# Patient Record
Sex: Male | Born: 1970 | Race: White | Hispanic: No | Marital: Married | Smoking: Never smoker
Health system: Southern US, Community
[De-identification: ages and names within clinical notes are randomized; demographics above are authoritative.]

## PROBLEM LIST (undated history)

## (undated) DIAGNOSIS — T7840XA Allergy, unspecified, initial encounter: Secondary | ICD-10-CM

## (undated) DIAGNOSIS — M109 Gout, unspecified: Secondary | ICD-10-CM

## (undated) DIAGNOSIS — E119 Type 2 diabetes mellitus without complications: Secondary | ICD-10-CM

## (undated) DIAGNOSIS — K649 Unspecified hemorrhoids: Secondary | ICD-10-CM

## (undated) HISTORY — DX: Unspecified hemorrhoids: K64.9

## (undated) HISTORY — DX: Gout, unspecified: M10.9

## (undated) HISTORY — DX: Allergy, unspecified, initial encounter: T78.40XA

## (undated) HISTORY — DX: Type 2 diabetes mellitus without complications: E11.9

## (undated) HISTORY — PX: NO PAST SURGERIES: SHX2092

---

## 1998-08-22 ENCOUNTER — Ambulatory Visit (HOSPITAL_COMMUNITY): Admission: RE | Admit: 1998-08-22 | Discharge: 1998-08-22 | Payer: Self-pay | Admitting: Gynecology

## 2008-12-19 ENCOUNTER — Ambulatory Visit: Payer: Self-pay | Admitting: Internal Medicine

## 2008-12-19 DIAGNOSIS — M109 Gout, unspecified: Secondary | ICD-10-CM

## 2008-12-19 LAB — CONVERTED CEMR LAB
Benzodiazepines.: NEGATIVE
Bilirubin Urine: NEGATIVE
Cocaine Metabolites: NEGATIVE
Ketones, urine, test strip: NEGATIVE
Marijuana Metabolite: NEGATIVE
Nitrite: NEGATIVE
Opiate Screen, Urine: NEGATIVE
Phencyclidine (PCP): NEGATIVE
Specific Gravity, Urine: <1.005
pH: 7

## 2008-12-20 ENCOUNTER — Encounter: Payer: Self-pay | Admitting: Internal Medicine

## 2008-12-23 ENCOUNTER — Ambulatory Visit: Payer: Self-pay | Admitting: Internal Medicine

## 2008-12-26 ENCOUNTER — Telehealth (INDEPENDENT_AMBULATORY_CARE_PROVIDER_SITE_OTHER): Payer: Self-pay | Admitting: *Deleted

## 2008-12-26 LAB — CONVERTED CEMR LAB
ALT: 58 units/L — ABNORMAL HIGH (ref 0–53)
BUN: 15 mg/dL (ref 6–23)
Basophils Absolute: 0 10*3/uL (ref 0.0–0.1)
Calcium: 9.3 mg/dL (ref 8.4–10.5)
Cholesterol: 165 mg/dL (ref 0–200)
Eosinophils Relative: 1.2 % (ref 0.0–5.0)
GFR calc non Af Amer: 71.78 mL/min (ref >60)
HCT: 43.2 % (ref 39.0–52.0)
HDL: 34.9 mg/dL — ABNORMAL LOW (ref >39.00)
LDL Cholesterol: 97 mg/dL (ref 0–99)
Lymphocytes Relative: 39.3 % (ref 12.0–46.0)
Lymphs Abs: 1.5 10*3/uL (ref 0.7–4.0)
Monocytes Relative: 7.4 % (ref 3.0–12.0)
Neutrophils Relative %: 52.1 % (ref 43.0–77.0)
Platelets: 160 10*3/uL (ref 150.0–400.0)
Potassium: 4.3 meq/L (ref 3.5–5.1)
Triglycerides: 168 mg/dL — ABNORMAL HIGH (ref 0.0–149.0)
VLDL: 33.6 mg/dL (ref 0.0–40.0)
WBC: 3.7 10*3/uL — ABNORMAL LOW (ref 4.5–10.5)

## 2010-01-16 ENCOUNTER — Ambulatory Visit: Payer: Self-pay | Admitting: Internal Medicine

## 2010-05-11 ENCOUNTER — Telehealth: Payer: Self-pay | Admitting: Internal Medicine

## 2010-09-15 NOTE — Progress Notes (Signed)
Summary: INDOMETHACIN REFILL  Phone Note Refill Request Message from:  Patient on May 11, 2010 9:08 AM  Refills Requested: Medication #1:  INDOMETHACIN 25 MG CAPS one by mouth every 4  hours as needed for gout. PLEASE CALL PRESCRIPTION INTO  [facility] [facility];  PT PHONE NUMBER = [phone]  Initial call taken by: Jerolyn Shin,  May 11, 2010 9:09 AM  Follow-up for Phone Call        last ov- 01/16/10 last filled 01/16/10 #30.  Follow-up by: Army Fossa CMA,  May 11, 2010 9:14 AM  Additional Follow-up for Phone Call Additional follow up Details #1::        OK X 1 Additional Follow-up by: Marga Melnick MD,  May 11, 2010 4:52 PM    Prescriptions: INDOMETHACIN 25 MG CAPS (INDOMETHACIN) one by mouth every 4  hours as needed for gout  #30 x 0   Entered by:   Army Fossa CMA   Authorized by:   Marga Melnick MD   Signed by:   Army Fossa CMA on 05/11/2010   Method used:   Electronically to        [id number] (retail)       [address]       [place], [place]  [address]       Ph: [phone]       Fax: [phone]   RxID:   [id number]

## 2010-09-15 NOTE — Assessment & Plan Note (Signed)
Summary: gout flare-up//lch   Vital Signs:  Patient profile:   40 year old male Height:      75.25 inches Weight:      248 pounds BMI:     30.90 Temp:     98.2 degrees F oral Pulse rate:   72 / minute BP sitting:   122 / 82  (left arm)  Vitals Entered By: Jeremy Johann CMA (January 16, 2010 10:44 AM) CC: gout flare up x27month Comments --swelling --redness --pain REVIEWED MED LIST, PATIENT AGREED DOSE AND INSTRUCTION CORRECT    History of Present Illness: 5 weeks ago developed pain, swelling, redness at the R  great toe in the setting of a golf trip with his friends. he had some dietary indiscretions Overall symptoms better, the areas is still slightly sore  Allergies (verified): No Known Drug Allergies  Past History:  Past Medical History: Reviewed history from 12/19/2008 and no changes required. Gout Dx 2000s h/o hemorrhoids w/ occ bleed   Past Surgical History: Reviewed history from 12/19/2008 and no changes required. Denies surgical history  Social History: Reviewed history from 12/19/2008 and no changes required. 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   Extremities:  L foot normal R  foot normal except for slightly  puffines @ PIP R great toe w/o redness or warmnes   Impression & Recommendations:  Problem # 1:  GOUT (ICD-274.9)  nearly resolve gout  episode has  persistent puffiness at the right great toe last uric acid 8.1, he has very infrequent episodes Plan stick to his diet Prednisone for a few days Indocid  for future attacks  His updated medication list for this problem includes:    Indomethacin 25 Mg Caps (Indomethacin) ..... One by mouth every 4  hours as needed for  gout  Complete Medication List: 1)  Vitamin C  2)  Prednisone 10 Mg Tabs (Prednisone) .... 3 by mouth once daily x 2, 2x2,1x2 3)  Indomethacin 25 Mg Caps (Indomethacin) .... One by mouth every 4  hours as needed for gout  Patient Instructions: 1)  take the prednisone now 2)  Use Indocin if needed, if he has another attack Prescriptions: INDOMETHACIN 25 MG CAPS (INDOMETHACIN) one by mouth every 4  hours as needed for gout  #30 x 0   Entered and Authorized by:   Nolon Rod. Paz MD   Signed by:   Nolon Rod. Paz MD on 01/16/2010   Method used:   Print then Give to Patient   RxID:   [id number] PREDNISONE 10 MG TABS (PREDNISONE) 3 by mouth once daily x 2, 2x2,1x2  #12 x 0   Entered and Authorized by:   Nolon Rod. Paz MD   Signed by:   Nolon Rod. Paz MD on 01/16/2010   Method used:   Print then Give to Patient   RxID:   [id number]

## 2011-06-02 ENCOUNTER — Other Ambulatory Visit: Payer: Self-pay | Admitting: Internal Medicine

## 2012-10-24 ENCOUNTER — Encounter: Payer: Self-pay | Admitting: Internal Medicine

## 2012-10-24 ENCOUNTER — Ambulatory Visit (INDEPENDENT_AMBULATORY_CARE_PROVIDER_SITE_OTHER): Payer: Managed Care, Other (non HMO) | Admitting: Internal Medicine

## 2012-10-24 VITALS — BP 122/78 | HR 67 | Temp 98.0°F | Ht 75.0 in | Wt 238.0 lb

## 2012-10-24 DIAGNOSIS — K649 Unspecified hemorrhoids: Secondary | ICD-10-CM

## 2012-10-24 MED ORDER — HYDROCORTISONE ACE-PRAMOXINE 2.5-1 % RE CREA: TOPICAL_CREAM | Freq: Three times a day (TID) | RECTAL | Status: DC

## 2012-10-24 NOTE — Patient Instructions (Addendum)
Bath sitz analpram for 4-5 days Also use Nupercainal OTC ointment as needed for pain Metamucil 2 tabs a day Please call if symptoms severe or no better in few days (GI referral) --- Schedule a physical at your convenience   Hemorrhoids Hemorrhoids are enlarged (dilated) veins around the rectum. There are 2 types of hemorrhoids, and the type of hemorrhoid is determined by its location. Internal hemorrhoids occur in the veins just inside the rectum.They are usually not painful, but they may bleed.However, they may poke through to the outside and become irritated and painful. External hemorrhoids involve the veins outside the anus and can be felt as a painful swelling or hard lump near the anus.They are often itchy and may crack and bleed. Sometimes clots will form in the veins. This makes them swollen and painful. These are called thrombosed hemorrhoids. CAUSES Causes of hemorrhoids include:  Pregnancy. This increases the pressure in the hemorrhoidal veins.  Constipation.  Straining to have a bowel movement.  Obesity.  Heavy lifting or other activity that caused you to strain. TREATMENT Most of the time hemorrhoids improve in 1 to 2 weeks. However, if symptoms do not seem to be getting better or if you have a lot of rectal bleeding, your caregiver may perform a procedure to help make the hemorrhoids get smaller or remove them completely.Possible treatments include:  Rubber band ligation. A rubber band is placed at the base of the hemorrhoid to cut off the circulation.  Sclerotherapy. A chemical is injected to shrink the hemorrhoid.  Infrared light therapy. Tools are used to burn the hemorrhoid.  Hemorrhoidectomy. This is surgical removal of the hemorrhoid. HOME CARE INSTRUCTIONS   Increase fiber in your diet. Ask your caregiver about using fiber supplements.  Drink enough water and fluids to keep your urine clear or pale yellow.  Exercise regularly.  Go to the bathroom  when you have the urge to have a bowel movement. Do not wait.  Avoid straining to have bowel movements.  Keep the anal area dry and clean.  Only take over-the-counter or prescription medicines for pain, discomfort, or fever as directed by your caregiver. If your hemorrhoids are thrombosed:  Take warm sitz baths for 20 to 30 minutes, 3 to 4 times per day.  If the hemorrhoids are very tender and swollen, place ice packs on the area as tolerated. Using ice packs between sitz baths may be helpful. Fill a plastic bag with ice. Place a towel between the bag of ice and your skin.  Medicated creams and suppositories may be used or applied as directed.  Do not use a donut-shaped pillow or sit on the toilet for long periods. This increases blood pooling and pain. SEEK MEDICAL CARE IF:   You have increasing pain and swelling that is not controlled with your medicine.  You have uncontrolled bleeding.  You have difficulty or you are unable to have a bowel movement.  You have pain or inflammation outside the area of the hemorrhoids.  You have chills or an oral temperature above 102 F (38.9 C). MAKE SURE YOU:   Understand these instructions.  Will watch your condition.  Will get help right away if you are not doing well or get worse. Document Released: 07/30/2000 Document Revised: 10/25/2011 Document Reviewed: 07/13/2010 [facility] Patient Information 2013 [place], [place].

## 2012-10-24 NOTE — Progress Notes (Signed)
  Subjective:    Patient ID: Steve Allen, male    DOB: 12/28/1970, 42 y.o.   MRN: [id number]  HPI Last visit 2011, seen acutely today for hemorrhoids. Last week developed red blood per rectum as well as pain with bowel movements. He use some OTC creams ; the bleeding has to stop now  but he still has some pain particularly on the left side of the anorectal area. No actual lump, mild swelling? Has no family history of colon cancer.  Past Medical History  Diagnosis Date  . Gout   . Hemorrhoids    Past Surgical History  Procedure Laterality Date  . No past surgeries      History   Social History  . Marital Status: Married    Spouse Name: N/A    Number of Children: 1  . Years of Education: N/A   Occupational History  . 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 Smoking status: Never Smoker   . Smokeless tobacco: Never Used  . Alcohol Use: Yes     Comment: 1-2 glasses of wine or beer a week  . Drug Use: No  . Sexually Active: Yes   Other Topics Concern  . Not on file   Social History Narrative   grow up in [place] , in [place] since 1995                Review of Systems  no fever, chills, nausea, vomiting, diarrhea    Objective:   Physical Exam  General -- alert, well-developed  HEENT -- not pale or jaundice Abdomen--soft, non-tender, no distention, no masses, no HSM, no guarding, and no rigidity.   Extremities-- no pretibial edema bilaterally Rectal-- skin tag externally . Normal sphincter tone. No rectal masses; mild to moderate discomfort during examination. t Brown stool  Prostate:  Prostate gland firm and smooth, no enlargement, nodularity, tenderness Anoscopy -- several moderate size internal hemorrhoids, 2 of them with evidence of recent bleeding Neurologic-- alert & oriented X3 and strength normal in all extremities. Psych-- Cognition and judgment appear intact. Alert and cooperative with normal attention span and concentration.   not anxious appearing and not depressed appearing.      Assessment & Plan:

## 2012-10-24 NOTE — Assessment & Plan Note (Addendum)
Recent sx likely   from int hemorrhoids Plan: See instructions If sx severe or persisten will rec GI eval as pt never had a cscope ( no family history of colon cancer)

## 2013-03-27 ENCOUNTER — Telehealth: Payer: Self-pay | Admitting: Internal Medicine

## 2013-03-27 MED ORDER — INDOMETHACIN 25 MG PO CAPS: ORAL_CAPSULE | ORAL | Status: DC

## 2013-03-27 NOTE — Telephone Encounter (Signed)
Advise patient, needs an appointment for a yearly checkup for at least a routine checkup for gout. Okay #30, no refills

## 2013-03-27 NOTE — Telephone Encounter (Signed)
Refill done per orders. lmovm to return call and schedule OV. Mailed letter as well.

## 2013-03-27 NOTE — Telephone Encounter (Signed)
Noted. Mailed letter as well making aware of need for office visit.

## 2013-03-27 NOTE — Telephone Encounter (Signed)
Patient returned Stephanie's call. I advised pt he needed an appt (per Dr Drue Novel). Patient stated he would call back if he wanted to do that then hung up the phone.

## 2013-03-27 NOTE — Telephone Encounter (Signed)
Patient is calling to request a refill on his indomethacin (INDOCIN) 25 MG capsule medication. Last OV 10/24/12. Please advise.

## 2013-03-27 NOTE — Telephone Encounter (Signed)
Ok to refill Indocin? Last OV 3.11.14 Last filled 10.19.12 #30 no refills. Pt. Takes as needed for gout flare up.

## 2013-04-27 ENCOUNTER — Ambulatory Visit (INDEPENDENT_AMBULATORY_CARE_PROVIDER_SITE_OTHER): Payer: Managed Care, Other (non HMO) | Admitting: Internal Medicine

## 2013-04-27 VITALS — BP 162/85 | HR 100 | Temp 97.5°F | Wt 238.0 lb

## 2013-04-27 DIAGNOSIS — K649 Unspecified hemorrhoids: Secondary | ICD-10-CM

## 2013-04-27 MED ORDER — HYDROCORTISONE ACE-PRAMOXINE 2.5-1 % RE CREA: TOPICAL_CREAM | Freq: Three times a day (TID) | RECTAL | Status: DC

## 2013-04-27 NOTE — Assessment & Plan Note (Signed)
Ongoing sx. Recommend maximal med therapy: Metamucil  Add a stool softener Refer to GI (Also, BP slightly high, usually okay. Plan--Observation)

## 2013-04-27 NOTE — Progress Notes (Signed)
  Subjective:    Patient ID: Steve Allen, male    DOB: 05/27/1971, 42 y.o.   MRN: [id number]  HPI Here to discuss hemorrhoids. Still having symptoms every 2 or 3 weeks, symptoms are mostly discomfort when sitting, occasional bleeding, no itching. Uses analpram  and it helps temporarily.  Past Medical History  Diagnosis Date  . Gout   . Hemorrhoids    Past Surgical History  Procedure Laterality Date  . No past surgeries     History   Social History  . Marital Status: Married    Spouse Name: N/A    Number of Children: 1  . Years of Education: N/A   Occupational History  . 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 Smoking status: Never Smoker   . Smokeless tobacco: Never Used  . Alcohol Use: Yes     Comment: 1-2 glasses of wine or beer a week  . Drug Use: No  . Sexual Activity: Yes   Other Topics Concern  . Not on file   Social History Narrative   grow up in [place] , in [place] since 1995                  Review of Systems Diet has not changed Is now taking Metamucil Occasional constipation but not frequently. Subjectively, noticed  that symptoms increase after straining for instance lifting his children and playing w/ them      Objective:   Physical Exam BP 162/85  Pulse 100  Temp(Src) 97.5 F (36.4 C)  Wt 238 lb (107.956 kg)  BMI 29.75 kg/m2  SpO2 100%  General -- alert, well-developed, NAD.  Rectal--  external exam (-) except x 1 skin tag. Normal sphincter tone. No rectal masses or tenderness. Brown stool  Extremities-- no pretibial edema bilaterally  Psych-- Cognition and judgment appear intact. Alert and cooperative with normal attention span and concentration. not anxious appearing and not depressed appearing.       Assessment & Plan:

## 2013-04-27 NOTE — Patient Instructions (Signed)
Take Metamucil 2 capsules every day Colace 50 mg one tablet every day, this is a OTC  stool softener Use the ointment as needed Avoid straining We'll send you to the stomach doctor for evaluation and possibly treatment

## 2013-04-28 ENCOUNTER — Encounter: Payer: Self-pay | Admitting: Internal Medicine

## 2013-05-11 ENCOUNTER — Encounter: Payer: Self-pay | Admitting: Internal Medicine

## 2013-06-12 ENCOUNTER — Encounter: Payer: Self-pay | Admitting: Internal Medicine

## 2013-06-12 ENCOUNTER — Ambulatory Visit (INDEPENDENT_AMBULATORY_CARE_PROVIDER_SITE_OTHER): Payer: Managed Care, Other (non HMO) | Admitting: Internal Medicine

## 2013-06-12 VITALS — BP 138/72 | HR 80 | Ht 75.0 in | Wt 239.0 lb

## 2013-06-12 DIAGNOSIS — K649 Unspecified hemorrhoids: Secondary | ICD-10-CM

## 2013-06-12 NOTE — Patient Instructions (Signed)
Discontinue Analpram.  Continue taking Colace and eating fiber.  Follow up as needed                                               We are excited to introduce MyChart, a new best-in-class service that provides you online access to important information in your electronic medical record. We want to make it easier for you to view your health information - all in one secure location - when and where you need it. We expect MyChart will enhance the quality of care and service we provide.  When you register for MyChart, you can:    View your test results.    Request appointments and receive appointment reminders via email.    Request medication renewals.    View your medical history, allergies, medications and immunizations.    Communicate with your physician's office through a password-protected site.    Conveniently print information such as your medication lists.  To find out if MyChart is right for you, please talk to a member of our clinical staff today. We will gladly answer your questions about this free health and wellness tool.  If you are age 43 or older and want a member of your family to have access to your record, you must provide written consent by completing a proxy form available at our office. Please speak to our clinical staff about guidelines regarding accounts for patients younger than age 43.  As you activate your MyChart account and need any technical assistance, please call the MyChart technical support line at [phone]) [phone] [phone]) or email your question to [email or web address]. If you email your question(s), please include your name, a return phone number and the best time to reach you.  If you have non-urgent health-related questions, you can send a message to our office through MyChart at [email or web address]. If you have a medical emergency, call 911.  Thank you for using MyChart as your new health and wellness resource!   MyChart licensed from  [employer],  [id number]. Patents Pending.

## 2013-06-12 NOTE — Progress Notes (Signed)
Patient ID: Steve Allen, male   DOB: 22-Dec-1970, 42 y.o.   MRN: [id number] HPI: Steve Allen is a 42 year old male with a past medical history of gout and hemorrhoids is seen in consultation at request of Dr. Drue Novel for evaluation of hemorrhoids.  He is here alone today. He reports his issues with hemorrhoids and perianal pain and irritation started around January 2014. During the last 10 months he has had on and off flares the last of which was about 4 weeks ago. He reports his symptoms usually start with a hard stool and occasionally he does see scant red blood on the toilet tissue only. He reports that the pain is worse with passing the stool and it can be uncomfortable when he sits for prolonged periods. He reports he was started on Metamucil capsules and docusate one tablet daily. He was also given Analpram cream. With this his symptoms have improved and he is no longer having perianal pain or bleeding. He reports his stools are now no longer hard and have softened. He is having one stool a day. No melena. No abdominal pain. No weight loss. No change in bowel habits. No diarrhea or constipation. Good appetite. No fevers or chills. No nausea or vomiting.  He recalls 2 previous rectal exams both performed by primary care. He was told he had internal hemorrhoids.  Past Medical History  Diagnosis Date  . Gout   . Hemorrhoids     Past Surgical History  Procedure Laterality Date  . No past surgeries      Current Outpatient Prescriptions  Medication Sig Dispense Refill  . docusate sodium (COLACE) 50 MG capsule Take by mouth daily.      . indomethacin (INDOCIN) 25 MG capsule TAKE 1 CAPSULE BY MOUTH EVERY 4 HOURS AS NEEDED FOR GOUT  30 capsule  0  . psyllium (METAMUCIL) 0.52 G capsule Take 0.52 g by mouth 2 (two) times daily.       No current facility-administered medications for this visit.    No Known Allergies  Family History  Problem Relation Age of Onset  . Cancer - Colon Neg Hx   .  Cancer - Prostate Neg Hx     History  Substance Use Topics  . Smoking status: Never Smoker   . Smokeless tobacco: Never Used  . Alcohol Use: Yes     Comment: 1-2 glasses of wine or beer a week    ROS: As per history of present illness, otherwise negative  BP 138/72  Pulse 80  Ht 6\' 3"  (1.905 m)  Wt 239 lb (108.41 kg)  BMI 29.87 kg/m2 Constitutional: Well-developed and well-nourished. No distress. HEENT: Normocephalic and atraumatic. Oropharynx is clear and moist. No oropharyngeal exudate. Conjunctivae are normal.  No scleral icterus. Neck: Neck supple. Trachea midline. Cardiovascular: Normal rate, regular rhythm and intact distal pulses. No M/R/G Pulmonary/chest: Effort normal and breath sounds normal. No wheezing, rales or rhonchi. Abdominal: Soft, nontender, nondistended. Bowel sounds active throughout. There are no masses palpable. No hepatosplenomegaly. Extremities: no clubbing, cyanosis, or edema Lymphadenopathy: No cervical adenopathy noted. Neurological: Alert and oriented to person place and time. Skin: Skin is warm and dry. No rashes noted. Psychiatric: Normal mood and affect. Behavior is normal.  RELEVANT LABS AND IMAGING: CBC    Component Value Date/Time   WBC 3.7* 12/19/2008 1001   RBC 4.40 12/19/2008 1001   HGB 15.4 12/19/2008 1001   HCT 43.2 12/19/2008 1001   PLT 160.0 12/19/2008 1001   MCV 98.2 12/19/2008  1001   MCHC 35.5 12/19/2008 1001   RDW 11.5 12/19/2008 1001   LYMPHSABS 1.5 12/19/2008 1001   MONOABS 0.3 12/19/2008 1001   EOSABS 0.0 12/19/2008 1001   BASOSABS 0.0 12/19/2008 1001    CMP     Component Value Date/Time   NA 141 12/19/2008 1001   K 4.3 12/19/2008 1001   CL 104 12/19/2008 1001   CO2 31 12/19/2008 1001   GLUCOSE 101* 12/19/2008 1001   BUN 15 12/19/2008 1001   CREATININE 1.2 12/19/2008 1001   CALCIUM 9.3 12/19/2008 1001   AST 36 12/19/2008 1001   ALT 58* 12/19/2008 1001   GFRNONAA 71.78 12/19/2008 1001   Rectal exam March and September 2014 -- the 1st with internal  hemorrhoids at anoscopy, the second was tender and skin tag present, otherwise neg, brown stool  ASSESSMENT/PLAN:  42 year old male with a past medical history of gout and hemorrhoids is seen in consultation at request of Dr. Drue Novel for evaluation of hemorrhoids.   1.  Hemorrhoids/peri-anal pain -- he has responded completely to the addition of fiber to his diet and daily Docusate. At this point I think he can stop the Analpram altogether. We discussed the differential for his perianal pain which includes external hemorrhoids and fissure. Some of his symptoms seem fissure related, but no fissure was seen recently on rectal examination. At this point he is asymptomatic. I have recommended he continue daily fiber supplementation and Docusate 100 mg daily indefinitely. There are no other alarm symptoms to warrant colonoscopy at this time. We discussed this and he agrees with this plan. If his pain returns or further bleeding occurs, I have asked him to let me know he voices understanding.  2.  CRC screening -- average risk, colonoscopy for screening recommended age 13

## 2013-11-03 ENCOUNTER — Encounter (HOSPITAL_BASED_OUTPATIENT_CLINIC_OR_DEPARTMENT_OTHER): Payer: Self-pay | Admitting: Emergency Medicine

## 2013-11-03 ENCOUNTER — Emergency Department (HOSPITAL_BASED_OUTPATIENT_CLINIC_OR_DEPARTMENT_OTHER)
Admission: EM | Admit: 2013-11-03 | Discharge: 2013-11-03 | Disposition: A | Discharge status: Home / Self Care | Payer: Managed Care, Other (non HMO) | Attending: Emergency Medicine | Admitting: Emergency Medicine

## 2013-11-03 DIAGNOSIS — Z79899 Other long term (current) drug therapy: Secondary | ICD-10-CM | POA: Insufficient documentation

## 2013-11-03 DIAGNOSIS — M109 Gout, unspecified: Secondary | ICD-10-CM | POA: Insufficient documentation

## 2013-11-03 DIAGNOSIS — Z8679 Personal history of other diseases of the circulatory system: Secondary | ICD-10-CM | POA: Insufficient documentation

## 2013-11-03 DIAGNOSIS — Y929 Unspecified place or not applicable: Secondary | ICD-10-CM | POA: Insufficient documentation

## 2013-11-03 DIAGNOSIS — Y9389 Activity, other specified: Secondary | ICD-10-CM | POA: Insufficient documentation

## 2013-11-03 DIAGNOSIS — IMO0002 Reserved for concepts with insufficient information to code with codable children: Secondary | ICD-10-CM | POA: Insufficient documentation

## 2013-11-03 DIAGNOSIS — S71109A Unspecified open wound, unspecified thigh, initial encounter: Principal | ICD-10-CM | POA: Insufficient documentation

## 2013-11-03 DIAGNOSIS — Z23 Encounter for immunization: Secondary | ICD-10-CM | POA: Insufficient documentation

## 2013-11-03 DIAGNOSIS — S71009A Unspecified open wound, unspecified hip, initial encounter: Secondary | ICD-10-CM | POA: Insufficient documentation

## 2013-11-03 DIAGNOSIS — S71119A Laceration without foreign body, unspecified thigh, initial encounter: Secondary | ICD-10-CM

## 2013-11-03 MED ORDER — TETANUS-DIPHTH-ACELL PERTUSSIS 5-2.5-18.5 LF-MCG/0.5 IM SUSP
0.5000 mL | Freq: Once | INTRAMUSCULAR | Status: AC
  Administered 2013-11-03: 0.5 mL via INTRAMUSCULAR
  Filled 2013-11-03: qty 0.5

## 2013-11-03 NOTE — Discharge Instructions (Signed)
Staples out in 7 days.    Laceration Care, Adult A laceration is a cut that goes through all layers of the skin. The cut goes into the tissue beneath the skin. HOME CARE For stitches (sutures) or staples:  Keep the cut clean and dry.  If you have a bandage (dressing), change it at least once a day. Change the bandage if it gets wet or dirty, or as told by your doctor.  Wash the cut with soap and water 2 times a day. Rinse the cut with water. Pat it dry with a clean towel.  Put a thin layer of medicated cream on the cut as told by your doctor.  You may shower after the first 24 hours. Do not soak the cut in water until the stitches are removed.  Only take medicines as told by your doctor.  Have your stitches or staples removed as told by your doctor. For skin adhesive strips:  Keep the cut clean and dry.  Do not get the strips wet. You may take a bath, but be careful to keep the cut dry.  If the cut gets wet, pat it dry with a clean towel.  The strips will fall off on their own. Do not remove the strips that are still stuck to the cut. For wound glue:  You may shower or take baths. Do not soak or scrub the cut. Do not swim. Avoid heavy sweating until the glue falls off on its own. After a shower or bath, pat the cut dry with a clean towel.  Do not put medicine on your cut until the glue falls off.  If you have a bandage, do not put tape over the glue.  Avoid lots of sunlight or tanning lamps until the glue falls off. Put sunscreen on the cut for the first year to reduce your scar.  The glue will fall off on its own. Do not pick at the glue. You may need a tetanus shot if:  You cannot remember when you had your last tetanus shot.  You have never had a tetanus shot. If you need a tetanus shot and you choose not to have one, you may get tetanus. Sickness from tetanus can be serious. GET HELP RIGHT AWAY IF:   Your pain does not get better with medicine.  Your arm, hand,  leg, or foot loses feeling (numbness) or changes color.  Your cut is bleeding.  Your joint feels weak, or you cannot use your joint.  You have painful lumps on your body.  Your cut is red, puffy (swollen), or painful.  You have a red line on the skin near the cut.  You have yellowish-white fluid (pus) coming from the cut.  You have a fever.  You have a bad smell coming from the cut or bandage.  Your cut breaks open before or after stitches are removed.  You notice something coming out of the cut, such as wood or glass.  You cannot move a finger or toe. MAKE SURE YOU:   Understand these instructions.  Will watch your condition.  Will get help right away if you are not doing well or get worse. Document Released: 01/19/2008 Document Revised: 10/25/2011 Document Reviewed: 01/26/2011 [facility] Patient Information 2014 [place].

## 2013-11-03 NOTE — ED Notes (Signed)
Reports lacerated right anterior thigh with a power trimmer approx 1 hour.  Denies injury elsewhere.  Bleeding is controlled on arrival.  Pulses present in his feet.  Laceration is jagged, partial thickness, approx 4 cm long.

## 2013-11-03 NOTE — ED Provider Notes (Signed)
CSN: [id number]     Arrival date & time 11/03/13  1458 History  This chart was scribed for Shaune Pollack, MD by Maree Erie, ED Scribe. The patient was seen in room MH01/MH01. Patient's care was started at 4:34 PM.    Chief Complaint  Patient presents with  . Extremity Laceration     Patient is a 43 y.o. male presenting with skin laceration. The history is provided by the patient. No language interpreter was used.  Laceration Location:  Leg Leg laceration location:  R upper leg Length (cm):  7 cm Quality: jagged   Bleeding: controlled   Time since incident:  2 hours Laceration mechanism:  Metal edge Foreign body present:  No foreign bodies Ineffective treatments:  None tried Tetanus status:  Unknown   HPI Comments: Steve Allen is a 43 y.o. male who presents to the Emergency Department complaining of a jagged, approximately 7 cm right anterior mid thigh laceration that occurred two hours ago. He states he was using an edge trimmer and accidentally hit his leg with the tool. The bleeding is currently controlled. He denies that the wound has been washed since the incident occurred. He is unsure if he is up to date on his tetanus vaccination.     Past Medical History  Diagnosis Date  . Gout   . Hemorrhoids    Past Surgical History  Procedure Laterality Date  . No past surgeries     Family History  Problem Relation Age of Onset  . Cancer - Colon Neg Hx   . Cancer - Prostate Neg Hx    History  Substance Use Topics  . Smoking status: Never Smoker   . Smokeless tobacco: Never Used  . Alcohol Use: Yes     Comment: 1-2 glasses of wine or beer a week    Review of Systems  Constitutional: Negative for fever.  Skin: Positive for wound.  All other systems reviewed and are negative.      Allergies  Review of patient's allergies indicates no known allergies.  Home Medications   Current Outpatient Rx  Name  Route  Sig  Dispense  Refill  . docusate sodium  (COLACE) 50 MG capsule   Oral   Take by mouth daily.         . indomethacin (INDOCIN) 25 MG capsule      TAKE 1 CAPSULE BY MOUTH EVERY 4 HOURS AS NEEDED FOR GOUT   30 capsule   0     Due for office visit and labs for further refills   . psyllium (METAMUCIL) 0.52 G capsule   Oral   Take 0.52 g by mouth 2 (two) times daily.          Triage Vitals: BP 116/76  Pulse 96  Temp(Src) 98.8 F (37.1 C) (Oral)  Resp 16  Ht 6\' 3"  (1.905 m)  Wt 235 lb (106.595 kg)  BMI 29.37 kg/m2  SpO2 97%  Physical Exam  Nursing note and vitals reviewed. Constitutional: He is oriented to person, place, and time. He appears well-developed and well-nourished. No distress.  HENT:  Head: Normocephalic and atraumatic.  Eyes: EOM are normal.  Neck: Neck supple. No tracheal deviation present.  Cardiovascular: Normal rate and intact distal pulses.   Pulmonary/Chest: Effort normal. No respiratory distress.  Musculoskeletal: Normal range of motion.  Neurological: He is alert and oriented to person, place, and time.  Skin: Skin is warm and dry.  Right anterior thigh has a 7 cm jagged  laceration. Bleeding is currently controlled.  Psychiatric: He has a normal mood and affect. His behavior is normal.    ED Course  Procedures (including critical care time)  DIAGNOSTIC STUDIES: Oxygen Saturation is 97% on room air, adequate by my interpretation.    COORDINATION OF CARE: 4:37 PM -Will wash and repair laceration using staples. Patient verbalizes understanding and agrees with treatment plan.  LACERATION REPAIR PROCEDURE NOTE The patient's identification was confirmed and consent was obtained. This procedure was performed by Shaune Pollack, MD at 4:38 PM. Site: right anterior thigh Sterile procedures observed Anesthetic used (type and amt): 4 cc lidocaine with epinephrine Repair type used: Staples Length: 7 cm # of staples: 8 Antibx ointment applied Tetanus ordered Site anesthetized, irrigated  with NS and debrided, some tattooing of laceration, explored without evidence of foreign body, wound well approximated, site covered with dry, sterile dressing. Patient tolerated procedure well without complications. Instructions for care discussed verbally and patient provided with additional written instructions for homecare and f/u.   Labs Review Labs Reviewed - No data to display Imaging Review No results found.   EKG Interpretation None      MDM   Final diagnoses:  Laceration of thigh  I personally performed the services described in this documentation, which was scribed in my presence. The recorded information has been reviewed and considered.     Shaune Pollack, MD 11/03/13 [phone]

## 2014-10-02 ENCOUNTER — Ambulatory Visit (INDEPENDENT_AMBULATORY_CARE_PROVIDER_SITE_OTHER): Payer: Managed Care, Other (non HMO) | Admitting: Internal Medicine

## 2014-10-02 VITALS — BP 138/90 | HR 82 | Resp 16 | Ht 75.0 in | Wt 252.6 lb

## 2014-10-02 DIAGNOSIS — K6289 Other specified diseases of anus and rectum: Secondary | ICD-10-CM

## 2014-10-02 DIAGNOSIS — K602 Anal fissure, unspecified: Secondary | ICD-10-CM

## 2014-10-02 MED ORDER — AMBULATORY NON FORMULARY MEDICATION: Status: DC

## 2014-10-02 NOTE — Progress Notes (Signed)
   Subjective:    Patient ID: Steve Allen, male    DOB: [birth year], 44 y.o.   MRN: [id number]  HPI Mr. Valent is a 44 yo male with PMH of gout and internal hemorrhoids who is seen in follow-up. He was initially seen in October 2014 to evaluate hemorrhoids and perianal pain. He reports that he has been doing well though in December he developed a "flare" of what he felt to be hemorrhoids. This usually follows a large or hard stool. He describes pain with passing bowel movement and then soreness perianally for several days thereafter. Pain is worse after sitting. 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 Pain is better if he gets up walks around or lies down. He does occasionally see bright red blood on the toilet taper or stool after having painful defecation. No fecal seepage or perianal protrusions. No abdominal pain. No diarrhea. No change in bowel habit. Good appetite and stable weight. No nausea or vomiting. No upper GI hepatic biliary complaint. He is using docusate and fiber supplementation daily.  Review of Systems As per history of present illness, otherwise negative  Current Medications, Allergies, Past Medical History, Past Surgical History, Family History and Social History were reviewed in [employer] record.     Objective:   Physical Exam BP 138/90 mmHg  Pulse 82  Resp 16  Ht 6\' 3"  (1.905 m)  Wt 252 lb 9.6 oz (114.579 kg)  BMI 31.57 kg/m2 Constitutional: Well-developed and well-nourished. No distress. HEENT: Normocephalic and atraumatic.   No scleral icterus. Cardiovascular: Normal rate, regular rhythm and intact distal pulses.  Pulmonary/chest: Effort normal and breath sounds normal. No wheezing, rales or rhonchi. Abdominal: Soft, nontender, nondistended. Bowel sounds active throughout.  Rectal: Small skin tag, exquisite tenderness posteriorly with insertion of finger with no palpable mass, fissure not visualized today though endoscopy not  performed due to tenderness Extremities: no clubbing, cyanosis, or edema Neurological: Alert and oriented to person place and time. Skin: Skin is warm and dry. No rashes noted. Psychiatric: Normal mood and affect. Behavior is normal.     Assessment & Plan:  44 yo male with PMH of gout and internal hemorrhoids who is seen in follow-up.  1. Anal fissure -- symptoms are classic for anal fissure in that he is having pain with passing stool and then pain after defecation and with sitting. No evidence of external or thrombosed hemorrhoid today. Internal hemorrhoids would not be expected to cause such pain. I have recommended adjuvant glycerin ointment 0.125% 4 times daily for at least 2-3 weeks. I asked that he call me in 2-3 weeks to let me know if symptoms have resolved completely. If not we may need to increase dose of nitroglycerin or considered flexible sigmoidoscopy versus colonoscopy. Though we discussed colonoscopy, given symptoms classic for fissure, we have elected to not perform colonoscopy at this time. He is comfortable with this decision.

## 2014-10-02 NOTE — Patient Instructions (Addendum)
We have sent the following medications to your pharmacy for you to pick up at your convenience: Nitroglycerin 0.125% gel-Apply to rectum four times daily x 6 weeks  Please continue benefiber supplementation and Colace.  Please call our office in 1 month with an update on your condition. Our phone number is [phone].  CC:[facility]

## 2018-10-02 ENCOUNTER — Ambulatory Visit (INDEPENDENT_AMBULATORY_CARE_PROVIDER_SITE_OTHER): Payer: Managed Care, Other (non HMO)

## 2018-10-02 ENCOUNTER — Encounter: Payer: Self-pay | Admitting: Family Medicine

## 2018-10-02 ENCOUNTER — Ambulatory Visit (INDEPENDENT_AMBULATORY_CARE_PROVIDER_SITE_OTHER): Payer: Managed Care, Other (non HMO) | Admitting: Family Medicine

## 2018-10-02 VITALS — BP 130/80 | HR 92 | Ht 75.0 in | Wt 247.5 lb

## 2018-10-02 DIAGNOSIS — R05 Cough: Secondary | ICD-10-CM

## 2018-10-02 DIAGNOSIS — Z0000 Encounter for general adult medical examination without abnormal findings: Secondary | ICD-10-CM | POA: Diagnosis not present

## 2018-10-02 DIAGNOSIS — R059 Cough, unspecified: Secondary | ICD-10-CM | POA: Insufficient documentation

## 2018-10-02 DIAGNOSIS — M109 Gout, unspecified: Secondary | ICD-10-CM | POA: Diagnosis not present

## 2018-10-02 NOTE — Patient Instructions (Signed)
Health Maintenance, Male A healthy lifestyle and preventive care is important for your health and wellness. Ask your health care provider about what schedule of regular examinations is right for you. What should I know about weight and diet? Eat a Healthy Diet  Eat plenty of vegetables, fruits, whole grains, low-fat dairy products, and lean protein.  Do not eat a lot of foods high in solid fats, added sugars, or salt.  Maintain a Healthy Weight Regular exercise can help you achieve or maintain a healthy weight. You should:  Do at least 150 minutes of exercise each week. The exercise should increase your heart rate and make you sweat (moderate-intensity exercise).  Do strength-training exercises at least twice a week. Watch Your Levels of Cholesterol and Blood Lipids  Have your blood tested for lipids and cholesterol every 5 years starting at 49 years of age. If you are at high risk for heart disease, you should start having your blood tested when you are 49 years old. You may need to have your cholesterol levels checked more often if: ? Your lipid or cholesterol levels are high. ? You are older than 50 years of age. ? You are at high risk for heart disease. What should I know about cancer screening? Many types of cancers can be detected early and may often be prevented. Lung Cancer  You should be screened every year for lung cancer if: ? You are a current smoker who has smoked for at least 30 years. ? You are a former smoker who has quit within the past 15 years.  Talk to your health care provider about your screening options, when you should start screening, and how often you should be screened. Colorectal Cancer  Routine colorectal cancer screening usually begins at 50 years of age and should be repeated every 5-10 years until you are 49 years old. You may need to be screened more often if early forms of precancerous polyps or small growths are found. Your health care provider may  recommend screening at an earlier age if you have risk factors for colon cancer.  Your health care provider may recommend using home test kits to check for hidden blood in the stool.  A small camera at the end of a tube can be used to examine your colon (sigmoidoscopy or colonoscopy). This checks for the earliest forms of colorectal cancer. Prostate and Testicular Cancer  Depending on your age and overall health, your health care provider may do certain tests to screen for prostate and testicular cancer.  Talk to your health care provider about any symptoms or concerns you have about testicular or prostate cancer. Skin Cancer  Check your skin from head to toe regularly.  Tell your health care provider about any new moles or changes in moles, especially if: ? There is a change in a mole's size, shape, or color. ? You have a mole that is larger than a pencil eraser.  Always use sunscreen. Apply sunscreen liberally and repeat throughout the day.  Protect yourself by wearing long sleeves, pants, a wide-brimmed hat, and sunglasses when outside. What should I know about heart disease, diabetes, and high blood pressure?  If you are 18-39 years of age, have your blood pressure checked every 3-5 years. If you are 40 years of age or older, have your blood pressure checked every year. You should have your blood pressure measured twice-once when you are at a hospital or clinic, and once when you are not at a hospital   or clinic. Record the average of the two measurements. To check your blood pressure when you are not at a hospital or clinic, you can use: ? An automated blood pressure machine at a pharmacy. ? A home blood pressure monitor.  Talk to your health care provider about your target blood pressure.  If you are between 3-34 years old, ask your health care provider if you should take aspirin to prevent heart disease.  Have regular diabetes screenings by checking your fasting blood sugar  level. ? If you are at a normal weight and have a low risk for diabetes, have this test once every three years after the age of 55. ? If you are overweight and have a high risk for diabetes, consider being tested at a younger age or more often.  A one-time screening for abdominal aortic aneurysm (AAA) by ultrasound is recommended for men aged 56-75 years who are current or former smokers. What should I know about preventing infection? Hepatitis B If you have a higher risk for hepatitis B, you should be screened for this virus. Talk with your health care provider to find out if you are at risk for hepatitis B infection. Hepatitis C Blood testing is recommended for:  Everyone born from [birth year] through 1966.  Anyone with known risk factors for hepatitis C. Sexually Transmitted Diseases (STDs)  You should be screened each year for STDs including gonorrhea and chlamydia if: ? You are sexually active and are younger than 49 years of age. ? You are older than 49 years of age and your health care provider tells you that you are at risk for this type of infection. ? Your sexual activity has changed since you were last screened and you are at an increased risk for chlamydia or gonorrhea. Ask your health care provider if you are at risk.  Talk with your health care provider about whether you are at high risk of being infected with HIV. Your health care provider may recommend a prescription medicine to help prevent HIV infection. What else can I do?  Schedule regular health, dental, and eye exams.  Stay current with your vaccines (immunizations).  Do not use any tobacco products, such as cigarettes, chewing tobacco, and e-cigarettes. If you need help quitting, ask your health care provider.  Limit alcohol intake to no more than 2 drinks per day. One drink equals 12 ounces of beer, 5 ounces of wine, or 1 ounces of hard liquor.  Do not use street drugs.  Do not share needles.  Ask your health  care provider for help if you need support or information about quitting drugs.  Tell your health care provider if you often feel depressed.  Tell your health care provider if you have ever been abused or do not feel safe at home. This information is not intended to replace advice given to you by your health care provider. Make sure you discuss any questions you have with your health care provider. Document Released: 01/29/2008 Document Revised: 03/31/2016 Document Reviewed: 05/06/2015 Elsevier Interactive Patient Education  2019 [employer].  Gout  Gout is a condition that causes painful swelling of the joints. Gout is a type of inflammation of the joints (arthritis). This condition is caused by having too much uric acid in the body. Uric acid is a chemical that forms when the body breaks down substances called purines. Purines are important for building body proteins. When the body has too much uric acid, sharp crystals can form and build  up inside the joints. This causes pain and swelling. Gout attacks can happen quickly and may be very painful (acute gout). Over time, the attacks can affect more joints and become more frequent (chronic gout). Gout can also cause uric acid to build up under the skin and inside the kidneys. What are the causes? This condition is caused by too much uric acid in your blood. This can happen because:  Your kidneys do not remove enough uric acid from your blood. This is the most common cause.  Your body makes too much uric acid. This can happen with some cancers and cancer treatments. It can also occur if your body is breaking down too many red blood cells (hemolytic anemia).  You eat too many foods that are high in purines. These foods include organ meats and some seafood. Alcohol, especially beer, is also high in purines. A gout attack may be triggered by trauma or stress. What increases the risk? You are more likely to develop this condition if you:  Have  a family history of gout.  Are male and middle-aged.  Are male and have gone through menopause.  Are obese.  Frequently drink alcohol, especially beer.  Are dehydrated.  Lose weight too quickly.  Have an organ transplant.  Have lead poisoning.  Take certain medicines, including aspirin, cyclosporine, diuretics, levodopa, and niacin.  Have kidney disease.  Have a skin condition called psoriasis. What are the signs or symptoms? An attack of acute gout happens quickly. It usually occurs in just one joint. The most common place is the big toe. Attacks often start at night. Other joints that may be affected include joints of the feet, ankle, knee, fingers, wrist, or elbow. Symptoms of this condition may include:  Severe pain.  Warmth.  Swelling.  Stiffness.  Tenderness. The affected joint may be very painful to touch.  Shiny, red, or purple skin.  Chills and fever. Chronic gout may cause symptoms more frequently. More joints may be involved. You may also have white or yellow lumps (tophi) on your hands or feet or in other areas near your joints. How is this diagnosed? This condition is diagnosed based on your symptoms, medical history, and physical exam. You may have tests, such as:  Blood tests to measure uric acid levels.  Removal of joint fluid with a thin needle (aspiration) to look for uric acid crystals.  X-rays to look for joint damage. How is this treated? Treatment for this condition has two phases: treating an acute attack and preventing future attacks. Acute gout treatment may include medicines to reduce pain and swelling, including:  NSAIDs.  Steroids. These are strong anti-inflammatory medicines that can be taken by mouth (orally) or injected into a joint.  Colchicine. This medicine relieves pain and swelling when it is taken soon after an attack. It can be given by mouth or through an IV. Preventive treatment may include:  Daily use of smaller  doses of NSAIDs or colchicine.  Use of a medicine that reduces uric acid levels in your blood.  Changes to your diet. You may need to see a dietitian about what to eat and drink to prevent gout. Follow these instructions at home: During a gout attack   If directed, put ice on the affected area: ? Put ice in a plastic bag. ? Place a towel between your skin and the bag. ? Leave the ice on for 20 minutes, 2-3 times a day.  Raise (elevate) the affected joint above the level  of your heart as often as possible.  Rest the joint as much as possible. If the affected joint is in your leg, you may be given crutches to use.  Follow instructions from your health care provider about eating or drinking restrictions. Avoiding future gout attacks  Follow a low-purine diet as told by your dietitian or health care provider. Avoid foods and drinks that are high in purines, including liver, kidney, anchovies, asparagus, herring, mushrooms, mussels, and beer.  Maintain a healthy weight or lose weight if you are overweight. If you want to lose weight, talk with your health care provider. It is important that you do not lose weight too quickly.  Start or maintain an exercise program as told by your health care provider. Eating and drinking  Drink enough fluids to keep your urine pale yellow.  If you drink alcohol: ? Limit how much you use to:  0-1 drink a day for women.  0-2 drinks a day for men. ? Be aware of how much alcohol is in your drink. In the [place] one drink equals one 12 oz bottle of beer (355 mL) one 5 oz glass of wine (148 mL), or one 1 oz glass of hard liquor (44 mL). General instructions  Take over-the-counter and prescription medicines only as told by your health care provider.  Do not drive or use heavy machinery while taking prescription pain medicine.  Return to your normal activities as told by your health care provider. Ask your health care provider what activities are safe  for you.  Keep all follow-up visits as told by your health care provider. This is important. Contact a health care provider if you have:  Another gout attack.  Continuing symptoms of a gout attack after 10 days of treatment.  Side effects from your medicines.  Chills or a fever.  Burning pain when you urinate.  Pain in your lower back or belly. Get help right away if you:  Have severe or uncontrolled pain.  Cannot urinate. Summary  Gout is painful swelling of the joints caused by inflammation.  The most common site of pain is the big toe, but it can affect other joints in the body.  Medicines and dietary changes can help to prevent and treat gout attacks. This information is not intended to replace advice given to you by your health care provider. Make sure you discuss any questions you have with your health care provider. Document Released: 07/30/2000 Document Revised: 02/22/2018 Document Reviewed: 02/22/2018 Elsevier Interactive Patient Education  2019 [employer].

## 2018-10-02 NOTE — Progress Notes (Signed)
Established Patient Office Visit  Subjective:  Patient ID: Steve Allen, male    DOB: 21-Nov-1970  Age: 48 y.o. MRN: [id number]  CC:  Chief Complaint  Patient presents with  . Annual Exam    HPI Steve Allen presents for establishment of care and complete physical exam.  Patient has enjoyed relatively good health.  He does have a history of an acute gouty attack x1.  He was treated successfully for an anal fissure with nitroglycerin gel a few years ago.  He has a half brother who developed an aortic aneurysm.  Patient is nonfasting today.  He lives with his wife and children.  He does not smoke or use illicit drugs.  Is 1-2 servings and 5 alcoholic beverage weekly.  He developed a URI in January.  It seemed to resolve other than a nagging persistent intermittent cough.  Cough is nonproductive.  There is been no fevers chills nausea vomiting or wheezing.  He has no asthma history.  Past Medical History:  Diagnosis Date  . Gout   . Hemorrhoids     Past Surgical History:  Procedure Laterality Date  . NO PAST SURGERIES      Family History  Problem Relation Age of Onset  . Cancer - Colon Neg Hx   . Cancer - Prostate Neg Hx     Social History   Socioeconomic History  . Marital status: Married    Spouse name: Not on file  . Number of children: 1  . Years of education: Not on file  . Highest education level: Not on file  Occupational History  . Occupation: Electrical engineer for [employer]  . Financial resource strain: Not on file  . Food insecurity:    Worry: Not on file    Inability: Not on file  . Transportation needs:    Medical: Not on file    Non-medical: Not on file  Tobacco Use  . Smoking status: Never Smoker  . Smokeless tobacco: Never Used  Substance and Sexual Activity  . Alcohol use: Yes    Comment: 1-2 glasses of wine or beer a week  . Drug use: No  . Sexual activity: Yes  Lifestyle  . Physical activity:    Days per week: Not on file      Minutes per session: Not on file  . Stress: Not on file  Relationships  . Social connections:    Talks on phone: Not on file    Gets together: Not on file    Attends religious service: Not on file    Active member of club or organization: Not on file    Attends meetings of clubs or organizations: Not on file    Relationship status: Not on file  . Intimate partner violence:    Fear of current or ex partner: Not on file    Emotionally abused: Not on file    Physically abused: Not on file    Forced sexual activity: Not on file  Other Topics Concern  . Not on file  Social History Narrative   grow up in [place] , in [place] since 1995                 Outpatient Medications Prior to Visit  Medication Sig Dispense Refill  . AMBULATORY NON FORMULARY MEDICATION Medication Name: Nitroglycerin 0.125% gel-- Apply to rectum four times daily x 6 weeks for anal fissure 30 g 0  . docusate sodium (COLACE) 50 MG capsule Take by  mouth daily.     No facility-administered medications prior to visit.     No Known Allergies  ROS Review of Systems  Constitutional: Negative for chills, diaphoresis, fatigue, fever and unexpected weight change.  HENT: Negative.  Negative for postnasal drip, rhinorrhea, sinus pressure, sinus pain and sneezing.   Eyes: Negative for photophobia and visual disturbance.  Respiratory: Positive for cough. Negative for shortness of breath and wheezing.   Cardiovascular: Negative.   Gastrointestinal: Negative for vomiting.  Endocrine: Negative for polyphagia and polyuria.  Genitourinary: Negative.   Musculoskeletal: Negative for arthralgias and myalgias.  Allergic/Immunologic: Negative for immunocompromised state.  Neurological: Negative for light-headedness, numbness and headaches.  Hematological: Does not bruise/bleed easily.  Psychiatric/Behavioral: Negative.       Objective:    Physical Exam  Constitutional: He is oriented to person, place, and time. He  appears well-developed and well-nourished. No distress.  HENT:  Head: Normocephalic and atraumatic.  Right Ear: External ear normal.  Left Ear: External ear normal.  Mouth/Throat: Oropharynx is clear and moist. No oropharyngeal exudate.  Eyes: Pupils are equal, round, and reactive to light. Conjunctivae are normal. Right eye exhibits no discharge. Left eye exhibits no discharge. No scleral icterus.  Neck: Neck supple. No JVD present. No tracheal deviation present. No thyromegaly present.  Cardiovascular: Normal rate, regular rhythm and normal heart sounds.  Pulmonary/Chest: Effort normal and breath sounds normal. No stridor.  Abdominal: Soft. Bowel sounds are normal. He exhibits no distension. There is no abdominal tenderness. There is no rebound and no guarding. Hernia confirmed negative in the right inguinal area and confirmed negative in the left inguinal area.  Genitourinary:    Penis normal.  Right testis shows no mass, no swelling and no tenderness. Right testis is descended. Left testis shows no mass, no swelling and no tenderness. Left testis is descended. Circumcised. No hypospadias, penile erythema or penile tenderness. No discharge found.  Musculoskeletal:        General: No edema.  Lymphadenopathy:    He has no cervical adenopathy.       Right: No inguinal adenopathy present.       Left: No inguinal adenopathy present.  Neurological: He is alert and oriented to person, place, and time.  Skin: Skin is warm and dry. He is not diaphoretic.  Psychiatric: He has a normal mood and affect. His behavior is normal.    BP 130/80   Pulse 92   Ht 6\' 3"  (1.905 m)   Wt 247 lb 8 oz (112.3 kg)   SpO2 97%   BMI 30.94 kg/m  Wt Readings from Last 3 Encounters:  10/02/18 247 lb 8 oz (112.3 kg)  10/02/14 252 lb 9.6 oz (114.6 kg)  11/03/13 235 lb (106.6 kg)   BP Readings from Last 3 Encounters:  10/02/18 130/80  10/02/14 138/90  11/03/13 109/70   Guideline developer:  UpToDate (see  UpToDate for funding source) Date Released: June 2014  There are no preventive care reminders to display for this patient.  There are no preventive care reminders to display for this patient.  Lab Results  Component Value Date   TSH 1.95 12/19/2008   Lab Results  Component Value Date   WBC 3.7 (L) 12/19/2008   HGB 15.4 12/19/2008   HCT 43.2 12/19/2008   MCV 98.2 12/19/2008   PLT 160.0 12/19/2008   Lab Results  Component Value Date   NA 141 12/19/2008   K 4.3 12/19/2008   CO2 31 12/19/2008   GLUCOSE  101 (H) 12/19/2008   BUN 15 12/19/2008   CREATININE 1.2 12/19/2008   AST 36 12/19/2008   ALT 58 (H) 12/19/2008   CALCIUM 9.3 12/19/2008   Lab Results  Component Value Date   CHOL 165 12/19/2008   Lab Results  Component Value Date   HDL 34.90 (L) 12/19/2008   Lab Results  Component Value Date   LDLCALC 97 12/19/2008   Lab Results  Component Value Date   TRIG 168.0 (H) 12/19/2008   Lab Results  Component Value Date   CHOLHDL 5 12/19/2008   No results found for: HGBA1C    Assessment & Plan:   Problem List Items Addressed This Visit      Other   GOUT - Primary   Relevant Orders   Uric acid   Cough   Relevant Orders   CBC   DG Chest 2 View   Health care maintenance   Relevant Orders   CBC   Comprehensive metabolic panel   Lipid panel   Urinalysis, Routine w reflex microscopic   VITAMIN D 25 Hydroxy (Vit-D Deficiency, Fractures)      No orders of the defined types were placed in this encounter.   Follow-up: Return return fasting for blood work.Marland Kitchen

## 2018-10-03 ENCOUNTER — Other Ambulatory Visit (INDEPENDENT_AMBULATORY_CARE_PROVIDER_SITE_OTHER): Payer: Managed Care, Other (non HMO)

## 2018-10-03 DIAGNOSIS — M109 Gout, unspecified: Secondary | ICD-10-CM | POA: Diagnosis not present

## 2018-10-03 DIAGNOSIS — Z0000 Encounter for general adult medical examination without abnormal findings: Secondary | ICD-10-CM

## 2018-10-03 DIAGNOSIS — R05 Cough: Secondary | ICD-10-CM

## 2018-10-03 DIAGNOSIS — R059 Cough, unspecified: Secondary | ICD-10-CM

## 2018-10-03 LAB — URINALYSIS, ROUTINE W REFLEX MICROSCOPIC
Bilirubin Urine: NEGATIVE
HGB URINE DIPSTICK: NEGATIVE
Ketones, ur: NEGATIVE
LEUKOCYTE UA: NEGATIVE
Nitrite: NEGATIVE
RBC / HPF: NONE SEEN (ref 0–?)
Specific Gravity, Urine: 1.02 (ref 1.000–1.030)
TOTAL PROTEIN, URINE-UPE24: NEGATIVE
Urine Glucose: NEGATIVE
Urobilinogen, UA: 0.2 (ref 0.0–1.0)
WBC UA: NONE SEEN (ref 0–?)
pH: 6.5 (ref 5.0–8.0)

## 2018-10-03 LAB — URIC ACID: Uric Acid, Serum: 7.5 mg/dL (ref 4.0–7.8)

## 2018-10-03 LAB — LIPID PANEL
Cholesterol: 187 mg/dL (ref 0–200)
HDL: 33.5 mg/dL — ABNORMAL LOW (ref >39.00)
NonHDL: 153.29
TRIGLYCERIDES: 281 mg/dL — AB (ref 0.0–149.0)
Total CHOL/HDL Ratio: 6
VLDL: 56.2 mg/dL — ABNORMAL HIGH (ref 0.0–40.0)

## 2018-10-03 LAB — CBC
HCT: 44.4 % (ref 39.0–52.0)
Hemoglobin: 15.7 g/dL (ref 13.0–17.0)
MCHC: 35.4 g/dL (ref 30.0–36.0)
MCV: 97.4 fl (ref 78.0–100.0)
Platelets: 191 10*3/uL (ref 150.0–400.0)
RBC: 4.56 Mil/uL (ref 4.22–5.81)
RDW: 12.6 % (ref 11.5–15.5)
WBC: 4.4 10*3/uL (ref 4.0–10.5)

## 2018-10-03 LAB — COMPREHENSIVE METABOLIC PANEL
ALT: 119 U/L — AB (ref 0–53)
AST: 48 U/L — ABNORMAL HIGH (ref 0–37)
Albumin: 4.7 g/dL (ref 3.5–5.2)
Alkaline Phosphatase: 69 U/L (ref 39–117)
BUN: 21 mg/dL (ref 6–23)
CO2: 29 mEq/L (ref 19–32)
Calcium: 9.7 mg/dL (ref 8.4–10.5)
Chloride: 99 mEq/L (ref 96–112)
Creatinine, Ser: 1.12 mg/dL (ref 0.40–1.50)
GFR: 69.85 mL/min (ref >60.00)
GLUCOSE: 157 mg/dL — AB (ref 70–99)
Potassium: 4.4 mEq/L (ref 3.5–5.1)
Sodium: 138 mEq/L (ref 135–145)
Total Bilirubin: 0.7 mg/dL (ref 0.2–1.2)
Total Protein: 7.3 g/dL (ref 6.0–8.3)

## 2018-10-03 LAB — VITAMIN D 25 HYDROXY (VIT D DEFICIENCY, FRACTURES): VITD: 34.06 ng/mL (ref 30.00–100.00)

## 2018-10-03 LAB — LDL CHOLESTEROL, DIRECT: Direct LDL: 112 mg/dL

## 2018-10-06 ENCOUNTER — Telehealth: Payer: Self-pay | Admitting: Family Medicine

## 2018-10-06 NOTE — Telephone Encounter (Signed)
Noted, thanks!

## 2018-10-06 NOTE — Telephone Encounter (Signed)
Copied from [facility] [id number]. Topic: General - Other >> Oct 06, 2018  9:09 AM Yvette Rack wrote: Reason for CRM: pt calling back about lab results

## 2018-10-06 NOTE — Telephone Encounter (Signed)
Pt given results per Dr Ethelene Hal; see result note dated 10/06/2018.

## 2018-10-09 ENCOUNTER — Ambulatory Visit (INDEPENDENT_AMBULATORY_CARE_PROVIDER_SITE_OTHER): Payer: Managed Care, Other (non HMO) | Admitting: Family Medicine

## 2018-10-09 ENCOUNTER — Encounter: Payer: Self-pay | Admitting: Family Medicine

## 2018-10-09 VITALS — BP 128/80 | HR 86 | Ht 75.0 in | Wt 247.0 lb

## 2018-10-09 DIAGNOSIS — J4521 Mild intermittent asthma with (acute) exacerbation: Secondary | ICD-10-CM

## 2018-10-09 DIAGNOSIS — E781 Pure hyperglyceridemia: Secondary | ICD-10-CM | POA: Insufficient documentation

## 2018-10-09 DIAGNOSIS — E782 Mixed hyperlipidemia: Secondary | ICD-10-CM | POA: Insufficient documentation

## 2018-10-09 DIAGNOSIS — R748 Abnormal levels of other serum enzymes: Secondary | ICD-10-CM

## 2018-10-09 DIAGNOSIS — R7309 Other abnormal glucose: Secondary | ICD-10-CM

## 2018-10-09 DIAGNOSIS — J45909 Unspecified asthma, uncomplicated: Secondary | ICD-10-CM | POA: Insufficient documentation

## 2018-10-09 DIAGNOSIS — E119 Type 2 diabetes mellitus without complications: Secondary | ICD-10-CM

## 2018-10-09 LAB — HEPATIC FUNCTION PANEL
ALT: 142 U/L — ABNORMAL HIGH (ref 0–53)
AST: 64 U/L — ABNORMAL HIGH (ref 0–37)
Albumin: 4.7 g/dL (ref 3.5–5.2)
Alkaline Phosphatase: 70 U/L (ref 39–117)
BILIRUBIN TOTAL: 1 mg/dL (ref 0.2–1.2)
Bilirubin, Direct: 0.2 mg/dL (ref 0.0–0.3)
Total Protein: 7.4 g/dL (ref 6.0–8.3)

## 2018-10-09 LAB — HEMOGLOBIN A1C: Hgb A1c MFr Bld: 7.8 % — ABNORMAL HIGH (ref 4.6–6.5)

## 2018-10-09 MED ORDER — PREDNISONE 10 MG (21) PO TBPK: ORAL_TABLET | ORAL | 0 refills | Status: DC

## 2018-10-09 MED ORDER — METFORMIN HCL ER 500 MG PO TB24: 500.0000 mg | ORAL_TABLET | Freq: Every day | ORAL | 1 refills | Status: DC

## 2018-10-09 NOTE — Patient Instructions (Signed)
Dyslipidemia Dyslipidemia is an imbalance of waxy, fat-like substances (lipids) in the blood. The body needs lipids in small amounts. Dyslipidemia often involves a high level of cholesterol or triglycerides, which are types of lipids. Common forms of dyslipidemia include:  High levels of LDL cholesterol. LDL is the type of cholesterol that causes fatty deposits (plaques) to build up in the blood vessels that carry blood away from your heart (arteries).  Low levels of HDL cholesterol. HDL cholesterol is the type of cholesterol that protects against heart disease. High levels of HDL remove the LDL buildup from arteries.  High levels of triglycerides. Triglycerides are a fatty substance in the blood that is linked to a buildup of plaques in the arteries. What are the causes? Primary dyslipidemia is caused by changes (mutations) in genes that are passed down through families (inherited). These mutations cause several types of dyslipidemia. Secondary dyslipidemia is caused by lifestyle choices and diseases that lead to dyslipidemia, such as:  Eating a diet that is high in animal fat.  Not getting enough exercise.  Having diabetes, kidney disease, liver disease, or thyroid disease.  Drinking large amounts of alcohol.  Using certain medicines. What increases the risk? You are more likely to develop this condition if you are an older man or if you are a woman who has gone through menopause. Other risk factors include:  Having a family history of dyslipidemia.  Taking certain medicines, including birth control pills, steroids, some diuretics, and beta-blockers.  Smoking cigarettes.  Eating a high-fat diet.  Having certain medical conditions such as diabetes, polycystic ovary syndrome (PCOS), kidney disease, liver disease, or hypothyroidism.  Not exercising regularly.  Being overweight or obese with too much belly fat. What are the signs or symptoms? In most cases, dyslipidemia does not  usually cause any symptoms. In severe cases, very high lipid levels can cause:  Fatty bumps under the skin (xanthomas).  White or gray ring around the black center (pupil) of the eye. Very high triglyceride levels can cause inflammation of the pancreas (pancreatitis). How is this diagnosed? Your health care provider may diagnose dyslipidemia based on a routine blood test (fasting blood test). Because most people do not have symptoms of the condition, this blood testing (lipid profile) is done on adults age 49 and older and is repeated every 5 years. This test checks:  Total cholesterol. This measures the total amount of cholesterol in your blood, including LDL cholesterol, HDL cholesterol, and triglycerides. A healthy number is below 200.  LDL cholesterol. The target number for LDL cholesterol is different for each person, depending on individual risk factors. Ask your health care provider what your LDL cholesterol should be.  HDL cholesterol. An HDL level of 60 or higher is best because it helps to protect against heart disease. A number below 45 for men or below 64 for women increases the risk for heart disease.  Triglycerides. A healthy triglyceride number is below 150. If your lipid profile is abnormal, your health care provider may do other blood tests. How is this treated? Treatment depends on the type of dyslipidemia that you have and your other risk factors for heart disease and stroke. Your health care provider will have a target range for your lipid levels based on this information. For many people, this condition may be treated by lifestyle changes, such as diet and exercise. Your health care provider may recommend that you:  Get regular exercise.  Make changes to your diet.  Quit smoking if you  smoke. If diet changes and exercise do not help you reach your goals, your health care provider may also prescribe medicine to lower lipids. The most commonly prescribed type of medicine  lowers your LDL cholesterol (statin drug). If you have a high triglyceride level, your provider may prescribe another type of drug (fibrate) or an omega-3 fish oil supplement, or both. Follow these instructions at home:  Eating and drinking  Follow instructions from your health care provider or dietitian about eating or drinking restrictions.  Eat a healthy diet as told by your health care provider. This can help you reach and maintain a healthy weight, lower your LDL cholesterol, and raise your HDL cholesterol. This may include: ? Limiting your calories, if you are overweight. ? Eating more fruits, vegetables, whole grains, fish, and lean meats. ? Limiting saturated fat, trans fat, and cholesterol.  If you drink alcohol: ? Limit how much you use. ? Be aware of how much alcohol is in your drink. In the [place] one drink equals one 12 oz bottle of beer (355 mL), one 5 oz glass of wine (148 mL), or one 1 oz glass of hard liquor (44 mL).  Do not drink alcohol if: ? Your health care provider tells you not to drink. ? You are pregnant, may be pregnant, or are planning to become pregnant. Activity  Get regular exercise. Start an exercise and strength training program as told by your health care provider. Ask your health care provider what activities are safe for you. Your health care provider may recommend: ? 30 minutes of aerobic activity 4-6 days a week. Brisk walking is an example of aerobic activity. ? Strength training 2 days a week. General instructions  Do not use any products that contain nicotine or tobacco, such as cigarettes, e-cigarettes, and chewing tobacco. If you need help quitting, ask your health care provider.  Take over-the-counter and prescription medicines only as told by your health care provider. This includes supplements.  Keep all follow-up visits as told by your health care provider. Contact a health care provider if:  You are: ? Having trouble sticking to your  exercise or diet plan. ? Struggling to quit smoking or control your use of alcohol. Summary  Dyslipidemia often involves a high level of cholesterol or triglycerides, which are types of lipids.  Treatment depends on the type of dyslipidemia that you have and your other risk factors for heart disease and stroke.  For many people, treatment starts with lifestyle changes, such as diet and exercise.  Your health care provider may prescribe medicine to lower lipids. This information is not intended to replace advice given to you by your health care provider. Make sure you discuss any questions you have with your health care provider. Document Released: 08/07/2013 Document Revised: 03/27/2018 Document Reviewed: 03/03/2018 Elsevier Interactive Patient Education  2019 [place] refers to food and lifestyle choices that are based on the traditions of countries located on the [employer]. This way of eating has been shown to help prevent certain conditions and improve outcomes for people who have chronic diseases, like kidney disease and heart disease. What are tips for following this plan? Lifestyle  Cook and eat meals together with your family, when possible.  Drink enough fluid to keep your urine clear or pale yellow.  Be physically active every day. This includes: ? Aerobic exercise like running or swimming. ? Leisure activities like gardening, walking, or housework.  Get 7-8 hours  of sleep each night.  If recommended by your health care provider, drink red wine in moderation. This means 1 glass a day for nonpregnant women and 2 glasses a day for men. A glass of wine equals 5 oz (150 mL). Reading food labels   Check the serving size of packaged foods. For foods such as rice and pasta, the serving size refers to the amount of cooked product, not dry.  Check the total fat in packaged foods. Avoid foods that have saturated fat or trans  fats.  Check the ingredients list for added sugars, such as corn syrup. Shopping  At the grocery store, buy most of your food from the areas near the walls of the store. This includes: ? Fresh fruits and vegetables (produce). ? Grains, beans, nuts, and seeds. Some of these may be available in unpackaged forms or large amounts (in bulk). ? Fresh seafood. ? Poultry and eggs. ? Low-fat dairy products.  Buy whole ingredients instead of prepackaged foods.  Buy fresh fruits and vegetables in-season from local farmers markets.  Buy frozen fruits and vegetables in resealable bags.  If you do not have access to quality fresh seafood, buy precooked frozen shrimp or canned fish, such as tuna, salmon, or sardines.  Buy small amounts of raw or cooked vegetables, salads, or olives from the deli or salad bar at your store.  Stock your pantry so you always have certain foods on hand, such as olive oil, canned tuna, canned tomatoes, rice, pasta, and beans. Cooking  Cook foods with extra-virgin olive oil instead of using butter or other vegetable oils.  Have meat as a side dish, and have vegetables or grains as your main dish. This means having meat in small portions or adding small amounts of meat to foods like pasta or stew.  Use beans or vegetables instead of meat in common dishes like chili or lasagna.  Experiment with different cooking methods. Try roasting or broiling vegetables instead of steaming or sauteing them.  Add frozen vegetables to soups, stews, pasta, or rice.  Add nuts or seeds for added healthy fat at each meal. You can add these to yogurt, salads, or vegetable dishes.  Marinate fish or vegetables using olive oil, lemon juice, garlic, and fresh herbs. Meal planning   Plan to eat 1 vegetarian meal one day each week. Try to work up to 2 vegetarian meals, if possible.  Eat seafood 2 or more times a week.  Have healthy snacks readily available, such as: ? Vegetable sticks  with hummus. ? [place] yogurt. ? Fruit and nut trail mix.  Eat balanced meals throughout the week. This includes: ? Fruit: 2-3 servings a day ? Vegetables: 4-5 servings a day ? Low-fat dairy: 2 servings a day ? Fish, poultry, or lean meat: 1 serving a day ? Beans and legumes: 2 or more servings a week ? Nuts and seeds: 1-2 servings a day ? Whole grains: 6-8 servings a day ? Extra-virgin olive oil: 3-4 servings a day  Limit red meat and sweets to only a few servings a month What are my food choices?  Mediterranean diet ? Recommended ? Grains: Whole-grain pasta. Brown rice. Bulgar wheat. Polenta. Couscous. Whole-wheat bread. [place]. ? Vegetables: Artichokes. Beets. Broccoli. Cabbage. Carrots. Eggplant. Green beans. Chard. Kale. Spinach. Onions. Leeks. Peas. Squash. Tomatoes. Peppers. Radishes. ? Fruits: Apples. Apricots. Avocado. Berries. Bananas. Cherries. Dates. Figs. Grapes. Lemons. Melon. Oranges. Peaches. Plums. Pomegranate. ? Meats and other protein foods: Beans. Almonds. Sunflower seeds. [employer]  nuts. Peanuts. [place]. Salmon. Scallops. Shrimp. [place]. Tilapia. Clams. Oysters. Eggs. ? Dairy: Low-fat milk. Cheese. Greek yogurt. ? Beverages: Water. Red wine. Herbal tea. ? Fats and oils: Extra virgin olive oil. Avocado oil. Grape seed oil. ? Sweets and desserts: [place] yogurt with honey. Baked apples. Poached pears. Trail mix. ? Seasoning and other foods: Basil. Cilantro. Coriander. Cumin. Mint. Parsley. Sage. Rosemary. Tarragon. Garlic. Oregano. Thyme. Pepper. Balsalmic vinegar. Tahini. Hummus. Tomato sauce. Olives. Mushrooms. ? Limit these ? Grains: Prepackaged pasta or rice dishes. Prepackaged cereal with added sugar. ? Vegetables: Deep fried potatoes (french fries). ? Fruits: Fruit canned in syrup. ? Meats and other protein foods: Beef. Pork. Lamb. Poultry with skin. Hot dogs. [place] Salines. ? Dairy: Ice cream. Sour cream. Whole milk. ? Beverages: Juice. Sugar-sweetened soft drinks.  Beer. Liquor and spirits. ? Fats and oils: Butter. Canola oil. Vegetable oil. Beef fat (tallow). Lard. ? Sweets and desserts: Cookies. Cakes. Pies. Candy. ? Seasoning and other foods: Mayonnaise. Premade sauces and marinades. ? The items listed may not be a complete list. Talk with your dietitian about what dietary choices are right for you. Summary  The Mediterranean diet includes both food and lifestyle choices.  Eat a variety of fresh fruits and vegetables, beans, nuts, seeds, and whole grains.  Limit the amount of red meat and sweets that you eat.  Talk with your health care provider about whether it is safe for you to drink red wine in moderation. This means 1 glass a day for nonpregnant women and 2 glasses a day for men. A glass of wine equals 5 oz (150 mL). This information is not intended to replace advice given to you by your health care provider. Make sure you discuss any questions you have with your health care provider. Document Released: 03/25/2016 Document Revised: 04/27/2016 Document Reviewed: 03/25/2016 Elsevier Interactive Patient Education  2019 [employer].

## 2018-10-09 NOTE — Addendum Note (Signed)
Addended by: Jon Billings on: 10/09/2018 12:57 PM   Modules accepted: Orders

## 2018-10-09 NOTE — Progress Notes (Addendum)
Established Patient Office Visit  Subjective:  Patient ID: Steve Allen, male    DOB: 1971-07-31  Age: 48 y.o. MRN: [id number]  CC:  Chief Complaint  Patient presents with  . Follow-up    HPI Steve Allen presents for follow-up of his lab work and a cough that is persistent status post recent URI.  Cough is in the evening mostly.  It is nonproductive and dry.  There is been no fever or chills.  He has no history of asthma and does not smoke.  Blood sugar was elevated with the blood work.  Neither 1 of his parents had diabetes as far as he knows.  It does run in the family.  Dad was an alcoholic and his health history was largely unknown.  Triglycerides were elevated and patient is aware of this problem.  Patient occasionally drinks alcohol and when he does it is no more than 1 or 2 servings.  Past Medical History:  Diagnosis Date  . Gout   . Hemorrhoids     Past Surgical History:  Procedure Laterality Date  . NO PAST SURGERIES      Family History  Problem Relation Age of Onset  . Cancer - Colon Neg Hx   . Cancer - Prostate Neg Hx     Social History   Socioeconomic History  . Marital status: Married    Spouse name: Not on file  . Number of children: 1  . Years of education: Not on file  . Highest education level: Not on file  Occupational History  . Occupation: Electrical engineer for [employer]  . Financial resource strain: Not on file  . Food insecurity:    Worry: Not on file    Inability: Not on file  . Transportation needs:    Medical: Not on file    Non-medical: Not on file  Tobacco Use  . Smoking status: Never Smoker  . Smokeless tobacco: Never Used  Substance and Sexual Activity  . Alcohol use: Yes    Comment: 1-2 glasses of wine or beer a week  . Drug use: No  . Sexual activity: Yes  Lifestyle  . Physical activity:    Days per week: Not on file    Minutes per session: Not on file  . Stress: Not on file  Relationships  . Social  connections:    Talks on phone: Not on file    Gets together: Not on file    Attends religious service: Not on file    Active member of club or organization: Not on file    Attends meetings of clubs or organizations: Not on file    Relationship status: Not on file  . Intimate partner violence:    Fear of current or ex partner: Not on file    Emotionally abused: Not on file    Physically abused: Not on file    Forced sexual activity: Not on file  Other Topics Concern  . Not on file  Social History Narrative   grow up in [place] , in [place] since 1995                 No outpatient medications prior to visit.   No facility-administered medications prior to visit.     No Known Allergies  ROS Review of Systems  Constitutional: Negative for diaphoresis, fatigue, fever and unexpected weight change.  HENT: Negative.   Respiratory: Positive for cough. Negative for shortness of breath and wheezing.  Cardiovascular: Negative.   Endocrine: Negative for polyphagia and polyuria.  Genitourinary: Negative.   Musculoskeletal: Negative for joint swelling.  Skin: Negative for pallor and rash.  Allergic/Immunologic: Negative for immunocompromised state.  Neurological: Negative for seizures, speech difficulty and headaches.  Hematological: Does not bruise/bleed easily.  Psychiatric/Behavioral: Negative.       Objective:    Physical Exam  Constitutional: He is oriented to person, place, and time. He appears well-developed and well-nourished. No distress.  HENT:  Head: Normocephalic and atraumatic.  Right Ear: External ear normal.  Left Ear: External ear normal.  Mouth/Throat: Oropharynx is clear and moist. No oropharyngeal exudate.  Eyes: Pupils are equal, round, and reactive to light. Conjunctivae are normal. Right eye exhibits no discharge. Left eye exhibits no discharge. No scleral icterus.  Neck: Neck supple. No JVD present. No tracheal deviation present. No thyromegaly present.   Cardiovascular: Normal rate, regular rhythm and normal heart sounds.  Pulmonary/Chest: Effort normal and breath sounds normal. No stridor. No respiratory distress. He has no wheezes. He has no rales.  Lymphadenopathy:    He has no cervical adenopathy.  Neurological: He is alert and oriented to person, place, and time.  Skin: Skin is warm and dry. He is not diaphoretic.  Psychiatric: He has a normal mood and affect. His behavior is normal.    BP 128/80   Pulse 86   Ht 6\' 3"  (1.905 m)   Wt 247 lb (112 kg)   BMI 30.87 kg/m  Wt Readings from Last 3 Encounters:  10/09/18 247 lb (112 kg)  10/02/18 247 lb 8 oz (112.3 kg)  10/02/14 252 lb 9.6 oz (114.6 kg)   BP Readings from Last 3 Encounters:  10/09/18 128/80  10/02/18 130/80  10/02/14 138/90   Guideline developer:  UpToDate (see UpToDate for funding source) Date Released: June 2014  Health Maintenance Due  Topic Date Due  . HEMOGLOBIN A1C  08-17-69  . PNEUMOCOCCAL POLYSACCHARIDE VACCINE AGE 48-64 HIGH RISK  05/01/1972  . FOOT EXAM  05/01/1980  . OPHTHALMOLOGY EXAM  05/01/1980    There are no preventive care reminders to display for this patient.  Lab Results  Component Value Date   TSH 1.95 12/19/2008   Lab Results  Component Value Date   WBC 4.4 10/03/2018   HGB 15.7 10/03/2018   HCT 44.4 10/03/2018   MCV 97.4 10/03/2018   PLT 191.0 10/03/2018   Lab Results  Component Value Date   NA 138 10/03/2018   K 4.4 10/03/2018   CO2 29 10/03/2018   GLUCOSE 157 (H) 10/03/2018   BUN 21 10/03/2018   CREATININE 1.12 10/03/2018   BILITOT 1.0 10/09/2018   ALKPHOS 70 10/09/2018   AST 64 (H) 10/09/2018   ALT 142 (H) 10/09/2018   PROT 7.4 10/09/2018   ALBUMIN 4.7 10/09/2018   CALCIUM 9.7 10/03/2018   GFR 69.85 10/03/2018   Lab Results  Component Value Date   CHOL 187 10/03/2018   Lab Results  Component Value Date   HDL 33.50 (L) 10/03/2018   Lab Results  Component Value Date   LDLCALC 97 12/19/2008   Lab  Results  Component Value Date   TRIG 281.0 (H) 10/03/2018   Lab Results  Component Value Date   CHOLHDL 6 10/03/2018   Lab Results  Component Value Date   HGBA1C 7.8 (H) 10/09/2018      Assessment & Plan:   Problem List Items Addressed This Visit      Respiratory   Reactive  airway disease   Relevant Medications   predniSONE (STERAPRED UNI-PAK 21 TAB) 10 MG (21) TBPK tablet     Endocrine   Type 2 diabetes mellitus without complication, without long-term current use of insulin (HCC)   Relevant Medications   metFORMIN (GLUCOPHAGE-XR) 500 MG 24 hr tablet   Other Relevant Orders   Ambulatory referral to diabetic education     Other   Elevated glucose - Primary   Relevant Orders   Hemoglobin A1c (Completed)   Elevated liver enzymes   Relevant Orders   Hepatitis C antibody   Hepatitis B Surface AntiGEN   US Abdomen Limited RUQ   Hepatic function panel (Completed)   Hypertriglyceridemia      Meds ordered this encounter  Medications  . predniSONE (STERAPRED UNI-PAK 21 TAB) 10 MG (21) TBPK tablet    Sig: Take 6 today, 5 tomorrow, 4 the next day and then 3, 2, 1 and stop    Dispense:  21 tablet    Refill:  0  . metFORMIN (GLUCOPHAGE-XR) 500 MG 24 hr tablet    Sig: Take 1 tablet (500 mg total) by mouth at bedtime.    Dispense:  90 tablet    Refill:  1    Follow-up: Return in about 3 months (around 01/07/2019), or if symptoms worsen or fail to improve.   Patient was given information of dyslipidemia on the Mediterranean diet.  He had been consuming a low-carb diet and I suggested that may be part of the problem.  He will work on exercising and losing weight.  We did discuss the possibility of using metformin.  Suggested follow-up will be in 3 to 6 months.

## 2018-10-10 LAB — HEPATITIS C ANTIBODY
Hepatitis C Ab: NONREACTIVE
SIGNAL TO CUT-OFF: 0.01 (ref <1.00)

## 2018-10-10 LAB — HEPATITIS B SURFACE ANTIGEN: Hepatitis B Surface Ag: NONREACTIVE

## 2018-12-08 ENCOUNTER — Telehealth: Payer: Self-pay | Admitting: Family Medicine

## 2018-12-08 NOTE — Telephone Encounter (Signed)
Patient called and canceled his appointment for 3 month follow up. Patient was offered a virtual visit and declined. Patient is requesting only to come in and have labs done. Patient requested a call back from nurse.

## 2018-12-11 NOTE — Telephone Encounter (Signed)
lmovm letting patient know that he will need to be seen in additional to doing blood work. He is not due for follow up until the end of May, so by then we should be able to bring him in without any issues. A1C cannot be re-checked until the end of May. I advised him to call back to schedule the appointment.

## 2018-12-11 NOTE — Telephone Encounter (Signed)
PATIENT DEFINITELY NEEDS OV!

## 2019-01-12 ENCOUNTER — Ambulatory Visit: Payer: Managed Care, Other (non HMO) | Admitting: Family Medicine

## 2019-04-09 ENCOUNTER — Other Ambulatory Visit: Payer: Self-pay

## 2019-04-09 ENCOUNTER — Encounter: Payer: Self-pay | Admitting: Family Medicine

## 2019-04-09 ENCOUNTER — Ambulatory Visit (INDEPENDENT_AMBULATORY_CARE_PROVIDER_SITE_OTHER): Payer: Managed Care, Other (non HMO) | Admitting: Family Medicine

## 2019-04-09 VITALS — BP 124/70 | HR 88 | Ht 75.0 in | Wt 216.4 lb

## 2019-04-09 DIAGNOSIS — R748 Abnormal levels of other serum enzymes: Secondary | ICD-10-CM

## 2019-04-09 DIAGNOSIS — E119 Type 2 diabetes mellitus without complications: Secondary | ICD-10-CM

## 2019-04-09 DIAGNOSIS — Z23 Encounter for immunization: Secondary | ICD-10-CM | POA: Diagnosis not present

## 2019-04-09 DIAGNOSIS — Z8739 Personal history of other diseases of the musculoskeletal system and connective tissue: Secondary | ICD-10-CM

## 2019-04-09 DIAGNOSIS — E781 Pure hyperglyceridemia: Secondary | ICD-10-CM

## 2019-04-09 DIAGNOSIS — Z8619 Personal history of other infectious and parasitic diseases: Secondary | ICD-10-CM

## 2019-04-09 DIAGNOSIS — Z0000 Encounter for general adult medical examination without abnormal findings: Secondary | ICD-10-CM

## 2019-04-09 LAB — LIPID PANEL
Cholesterol: 164 mg/dL (ref 0–200)
HDL: 39.6 mg/dL (ref >39.00)
LDL Cholesterol: 103 mg/dL — ABNORMAL HIGH (ref 0–99)
NonHDL: 124.77
Total CHOL/HDL Ratio: 4
Triglycerides: 110 mg/dL (ref 0.0–149.0)
VLDL: 22 mg/dL (ref 0.0–40.0)

## 2019-04-09 LAB — URINALYSIS, ROUTINE W REFLEX MICROSCOPIC
Bilirubin Urine: NEGATIVE
Hgb urine dipstick: NEGATIVE
Ketones, ur: NEGATIVE
Leukocytes,Ua: NEGATIVE
Nitrite: NEGATIVE
RBC / HPF: NONE SEEN (ref 0–?)
Specific Gravity, Urine: 1.02 (ref 1.000–1.030)
Total Protein, Urine: NEGATIVE
Urine Glucose: NEGATIVE
Urobilinogen, UA: 0.2 (ref 0.0–1.0)
pH: 6 (ref 5.0–8.0)

## 2019-04-09 LAB — COMPREHENSIVE METABOLIC PANEL
ALT: 26 U/L (ref 0–53)
AST: 23 U/L (ref 0–37)
Albumin: 4.8 g/dL (ref 3.5–5.2)
Alkaline Phosphatase: 55 U/L (ref 39–117)
BUN: 21 mg/dL (ref 6–23)
CO2: 30 mEq/L (ref 19–32)
Calcium: 9.4 mg/dL (ref 8.4–10.5)
Chloride: 103 mEq/L (ref 96–112)
Creatinine, Ser: 0.99 mg/dL (ref 0.40–1.50)
GFR: 80.37 mL/min (ref >60.00)
Glucose, Bld: 112 mg/dL — ABNORMAL HIGH (ref 70–99)
Potassium: 4.3 mEq/L (ref 3.5–5.1)
Sodium: 140 mEq/L (ref 135–145)
Total Bilirubin: 0.8 mg/dL (ref 0.2–1.2)
Total Protein: 6.8 g/dL (ref 6.0–8.3)

## 2019-04-09 LAB — MICROALBUMIN / CREATININE URINE RATIO
Creatinine,U: 97.9 mg/dL
Microalb Creat Ratio: 0.7 mg/g (ref 0.0–30.0)
Microalb, Ur: <0.7 mg/dL (ref 0.0–1.9)

## 2019-04-09 LAB — CBC
HCT: 40 % (ref 39.0–52.0)
Hemoglobin: 13.9 g/dL (ref 13.0–17.0)
MCHC: 34.8 g/dL (ref 30.0–36.0)
MCV: 99.6 fl (ref 78.0–100.0)
Platelets: 177 10*3/uL (ref 150.0–400.0)
RBC: 4.02 Mil/uL — ABNORMAL LOW (ref 4.22–5.81)
RDW: 13.1 % (ref 11.5–15.5)
WBC: 3.2 10*3/uL — ABNORMAL LOW (ref 4.0–10.5)

## 2019-04-09 LAB — GAMMA GT: GGT: 18 U/L (ref 7–51)

## 2019-04-09 LAB — URIC ACID: Uric Acid, Serum: 8.1 mg/dL — ABNORMAL HIGH (ref 4.0–7.8)

## 2019-04-09 LAB — HEMOGLOBIN A1C: Hgb A1c MFr Bld: 5.5 % (ref 4.6–6.5)

## 2019-04-09 NOTE — Progress Notes (Signed)
Established Patient Office Visit  Subjective:  Patient ID: Steve Allen, male    DOB: 1970/10/22  Age: 48 y.o. MRN: [id number]  CC:  Chief Complaint  Patient presents with  . Follow-up    HPI Steve Allen presents for follow-up of his diabetes, elevated triglycerides and elevated LFTs.  Patient has remarkably adjusted his diet and started exercising.  He has been able to lose 31 pounds.  He never did fill the metformin or go for the abdominal ultrasound.  He had a viral syndrome just at the start of COVID and with like to be checked for follow-up antibodies.  Past Medical History:  Diagnosis Date  . Gout   . Hemorrhoids     Past Surgical History:  Procedure Laterality Date  . NO PAST SURGERIES      Family History  Problem Relation Age of Onset  . Cancer - Colon Neg Hx   . Cancer - Prostate Neg Hx     Social History   Socioeconomic History  . Marital status: Married    Spouse name: Not on file  . Number of children: 1  . Years of education: Not on file  . Highest education level: Not on file  Occupational History  . Occupation: Electrical engineer for [employer]  . Financial resource strain: Not on file  . Food insecurity    Worry: Not on file    Inability: Not on file  . Transportation needs    Medical: Not on file    Non-medical: Not on file  Tobacco Use  . Smoking status: Never Smoker  . Smokeless tobacco: Never Used  Substance and Sexual Activity  . Alcohol use: Yes    Comment: 1-2 glasses of wine or beer a week  . Drug use: No  . Sexual activity: Yes  Lifestyle  . Physical activity    Days per week: Not on file    Minutes per session: Not on file  . Stress: Not on file  Relationships  . Social Herbalist on phone: Not on file    Gets together: Not on file    Attends religious service: Not on file    Active member of club or organization: Not on file    Attends meetings of clubs or organizations: Not on file   Relationship status: Not on file  . Intimate partner violence    Fear of current or ex partner: Not on file    Emotionally abused: Not on file    Physically abused: Not on file    Forced sexual activity: Not on file  Other Topics Concern  . Not on file  Social History Narrative   grow up in [place] , in [place] since 1995                 Outpatient Medications Prior to Visit  Medication Sig Dispense Refill  . metFORMIN (GLUCOPHAGE-XR) 500 MG 24 hr tablet Take 1 tablet (500 mg total) by mouth at bedtime. 90 tablet 1  . predniSONE (STERAPRED UNI-PAK 21 TAB) 10 MG (21) TBPK tablet Take 6 today, 5 tomorrow, 4 the next day and then 3, 2, 1 and stop 21 tablet 0   No facility-administered medications prior to visit.     No Known Allergies  ROS Review of Systems  Constitutional: Negative.   HENT: Negative.   Eyes: Negative for photophobia and visual disturbance.  Respiratory: Negative.   Cardiovascular: Negative.   Gastrointestinal: Negative.  Endocrine: Negative for polyphagia and polyuria.  Genitourinary: Negative.   Musculoskeletal: Negative.   Skin: Negative for pallor and rash.  Allergic/Immunologic: Negative for immunocompromised state.  Neurological: Negative for light-headedness and numbness.  Hematological: Does not bruise/bleed easily.  Psychiatric/Behavioral: Negative.       Objective:    Physical Exam  Constitutional: He is oriented to person, place, and time. He appears well-developed and well-nourished. No distress.  HENT:  Head: Normocephalic and atraumatic.  Right Ear: External ear normal.  Left Ear: External ear normal.  Mouth/Throat: Oropharynx is clear and moist.  Eyes: Pupils are equal, round, and reactive to light. Conjunctivae are normal. Right eye exhibits no discharge. Left eye exhibits no discharge. No scleral icterus.  Neck: Neck supple. No JVD present. No tracheal deviation present. No thyromegaly present.  Cardiovascular: Normal rate, regular  rhythm and normal heart sounds.  Pulmonary/Chest: Effort normal and breath sounds normal.  Abdominal: Bowel sounds are normal.  Musculoskeletal:        General: No edema.  Lymphadenopathy:    He has no cervical adenopathy.  Neurological: He is alert and oriented to person, place, and time.  Skin: Skin is warm and dry. He is not diaphoretic.  Psychiatric: He has a normal mood and affect. His behavior is normal.    BP 124/70   Pulse 88   Ht 6\' 3"  (1.905 m)   Wt 216 lb 6 oz (98.1 kg)   SpO2 96%   BMI 27.05 kg/m  Wt Readings from Last 3 Encounters:  04/09/19 216 lb 6 oz (98.1 kg)  10/09/18 247 lb (112 kg)  10/02/18 247 lb 8 oz (112.3 kg)   BP Readings from Last 3 Encounters:  04/09/19 124/70  10/09/18 128/80  10/02/18 130/80   Guideline developer:  UpToDate (see UpToDate for funding source) Date Released: June 2014  Health Maintenance Due  Topic Date Due  . PNEUMOCOCCAL POLYSACCHARIDE VACCINE AGE 14-64 HIGH RISK  05/01/1972  . FOOT EXAM  05/01/1980  . OPHTHALMOLOGY EXAM  05/01/1980  . URINE MICROALBUMIN  05/01/1980  . HEMOGLOBIN A1C  04/09/2019    There are no preventive care reminders to display for this patient.  Lab Results  Component Value Date   TSH 1.95 12/19/2008   Lab Results  Component Value Date   WBC 4.4 10/03/2018   HGB 15.7 10/03/2018   HCT 44.4 10/03/2018   MCV 97.4 10/03/2018   PLT 191.0 10/03/2018   Lab Results  Component Value Date   NA 138 10/03/2018   K 4.4 10/03/2018   CO2 29 10/03/2018   GLUCOSE 157 (H) 10/03/2018   BUN 21 10/03/2018   CREATININE 1.12 10/03/2018   BILITOT 1.0 10/09/2018   ALKPHOS 70 10/09/2018   AST 64 (H) 10/09/2018   ALT 142 (H) 10/09/2018   PROT 7.4 10/09/2018   ALBUMIN 4.7 10/09/2018   CALCIUM 9.7 10/03/2018   GFR 69.85 10/03/2018   Lab Results  Component Value Date   CHOL 187 10/03/2018   Lab Results  Component Value Date   HDL 33.50 (L) 10/03/2018   Lab Results  Component Value Date   LDLCALC 97  12/19/2008   Lab Results  Component Value Date   TRIG 281.0 (H) 10/03/2018   Lab Results  Component Value Date   CHOLHDL 6 10/03/2018   Lab Results  Component Value Date   HGBA1C 7.8 (H) 10/09/2018      Assessment & Plan:   Problem List Items Addressed This Visit  Endocrine   Type 2 diabetes mellitus without complication, without long-term current use of insulin (HCC) - Primary   Relevant Orders   CBC   Comprehensive metabolic panel   Hemoglobin A1c   Microalbumin / creatinine urine ratio   Urinalysis, Routine w reflex microscopic     Other   Health care maintenance   Elevated liver enzymes   Relevant Orders   CBC   Comprehensive metabolic panel   Gamma GT   Hypertriglyceridemia   Relevant Orders   Lipid panel    Other Visit Diagnoses    Need for immunization against influenza       Relevant Orders   Flu Vaccine QUAD 36+ mos IM (Completed)   History of gout       Relevant Orders   Uric acid   H/O viral illness       Relevant Orders   SAR CoV2 Serology (COVID 19)AB(IGG)IA      No orders of the defined types were placed in this encounter.   Follow-up: No follow-ups on file.    With his significant intended weight loss I am expecting normal labs.  We will be excited to see this kind information for him.

## 2019-04-09 NOTE — Patient Instructions (Signed)
Mediterranean Diet A Mediterranean diet refers to food and lifestyle choices that are based on the traditions of countries located on the [employer]. This way of eating has been shown to help prevent certain conditions and improve outcomes for people who have chronic diseases, like kidney disease and heart disease. What are tips for following this plan? Lifestyle  Cook and eat meals together with your family, when possible.  Drink enough fluid to keep your urine clear or pale yellow.  Be physically active every day. This includes: ? Aerobic exercise like running or swimming. ? Leisure activities like gardening, walking, or housework.  Get 7-8 hours of sleep each night.  If recommended by your health care provider, drink red wine in moderation. This means 1 glass a day for nonpregnant women and 2 glasses a day for men. A glass of wine equals 5 oz (150 mL). Reading food labels   Check the serving size of packaged foods. For foods such as rice and pasta, the serving size refers to the amount of cooked product, not dry.  Check the total fat in packaged foods. Avoid foods that have saturated fat or trans fats.  Check the ingredients list for added sugars, such as corn syrup. Shopping  At the grocery store, buy most of your food from the areas near the walls of the store. This includes: ? Fresh fruits and vegetables (produce). ? Grains, beans, nuts, and seeds. Some of these may be available in unpackaged forms or large amounts (in bulk). ? Fresh seafood. ? Poultry and eggs. ? Low-fat dairy products.  Buy whole ingredients instead of prepackaged foods.  Buy fresh fruits and vegetables in-season from local farmers markets.  Buy frozen fruits and vegetables in resealable bags.  If you do not have access to quality fresh seafood, buy precooked frozen shrimp or canned fish, such as tuna, salmon, or sardines.  Buy small amounts of raw or cooked vegetables, salads, or olives from  the deli or salad bar at your store.  Stock your pantry so you always have certain foods on hand, such as olive oil, canned tuna, canned tomatoes, rice, pasta, and beans. Cooking  Cook foods with extra-virgin olive oil instead of using butter or other vegetable oils.  Have meat as a side dish, and have vegetables or grains as your main dish. This means having meat in small portions or adding small amounts of meat to foods like pasta or stew.  Use beans or vegetables instead of meat in common dishes like chili or lasagna.  Experiment with different cooking methods. Try roasting or broiling vegetables instead of steaming or sauteing them.  Add frozen vegetables to soups, stews, pasta, or rice.  Add nuts or seeds for added healthy fat at each meal. You can add these to yogurt, salads, or vegetable dishes.  Marinate fish or vegetables using olive oil, lemon juice, garlic, and fresh herbs. Meal planning   Plan to eat 1 vegetarian meal one day each week. Try to work up to 2 vegetarian meals, if possible.  Eat seafood 2 or more times a week.  Have healthy snacks readily available, such as: ? Vegetable sticks with hummus. ? [place] yogurt. ? Fruit and nut trail mix.  Eat balanced meals throughout the week. This includes: ? Fruit: 2-3 servings a day ? Vegetables: 4-5 servings a day ? Low-fat dairy: 2 servings a day ? Fish, poultry, or lean meat: 1 serving a day ? Beans and legumes: 2 or more servings a week ?  Nuts and seeds: 1-2 servings a day ? Whole grains: 6-8 servings a day ? Extra-virgin olive oil: 3-4 servings a day  Limit red meat and sweets to only a few servings a month What are my food choices?  Mediterranean diet ? Recommended  Grains: Whole-grain pasta. Brown rice. Bulgar wheat. Polenta. Couscous. Whole-wheat bread. Oatmeal. Quinoa.  Vegetables: Artichokes. Beets. Broccoli. Cabbage. Carrots. Eggplant. Green beans. Chard. Kale. Spinach. Onions. Leeks. Peas. Squash.  Tomatoes. Peppers. Radishes.  Fruits: Apples. Apricots. Avocado. Berries. Bananas. Cherries. Dates. Figs. Grapes. Lemons. Melon. Oranges. Peaches. Plums. Pomegranate.  Meats and other protein foods: Beans. Almonds. Sunflower seeds. Pine nuts. Peanuts. Cod. Salmon. Scallops. Shrimp. Tuna. Tilapia. Clams. Oysters. Eggs.  Dairy: Low-fat milk. Cheese. Greek yogurt.  Beverages: Water. Red wine. Herbal tea.  Fats and oils: Extra virgin olive oil. Avocado oil. Grape seed oil.  Sweets and desserts: Greek yogurt with honey. Baked apples. Poached pears. Trail mix.  Seasoning and other foods: Basil. Cilantro. Coriander. Cumin. Mint. Parsley. Sage. Rosemary. Tarragon. Garlic. Oregano. Thyme. Pepper. Balsalmic vinegar. Tahini. Hummus. Tomato sauce. Olives. Mushrooms. ? Limit these  Grains: Prepackaged pasta or rice dishes. Prepackaged cereal with added sugar.  Vegetables: Deep fried potatoes (french fries).  Fruits: Fruit canned in syrup.  Meats and other protein foods: Beef. Pork. Lamb. Poultry with skin. Hot dogs. Bacon.  Dairy: Ice cream. Sour cream. Whole milk.  Beverages: Juice. Sugar-sweetened soft drinks. Beer. Liquor and spirits.  Fats and oils: Butter. Canola oil. Vegetable oil. Beef fat (tallow). Lard.  Sweets and desserts: Cookies. Cakes. Pies. Candy.  Seasoning and other foods: Mayonnaise. Premade sauces and marinades. The items listed may not be a complete list. Talk with your dietitian about what dietary choices are right for you. Summary  The Mediterranean diet includes both food and lifestyle choices.  Eat a variety of fresh fruits and vegetables, beans, nuts, seeds, and whole grains.  Limit the amount of red meat and sweets that you eat.  Talk with your health care provider about whether it is safe for you to drink red wine in moderation. This means 1 glass a day for nonpregnant women and 2 glasses a day for men. A glass of wine equals 5 oz (150 mL). This information  is not intended to replace advice given to you by your health care provider. Make sure you discuss any questions you have with your health care provider. Document Released: 03/25/2016 Document Revised: 04/01/2016 Document Reviewed: 03/25/2016 Elsevier Patient Education  2020 Elsevier Inc.  

## 2019-04-10 LAB — SAR COV2 SEROLOGY (COVID19)AB(IGG),IA: SARS CoV2 AB IGG: NEGATIVE

## 2019-12-20 ENCOUNTER — Ambulatory Visit: Payer: Self-pay | Admitting: Family Medicine

## 2020-09-10 ENCOUNTER — Telehealth: Payer: Self-pay | Admitting: Family Medicine

## 2020-09-10 NOTE — Telephone Encounter (Signed)
Patient sent a MyChart message to schedule his annual physical. Called and left message to give the office a call back to schedule.

## 2020-10-06 IMAGING — DX DG CHEST 2V
2 series · 2 of 2 positions shown · non-contrast
Comparison: None.

CLINICAL DATA: Nonproductive cough for 2 and half months.

EXAM:
CHEST - 2 VIEW

[chest pa]
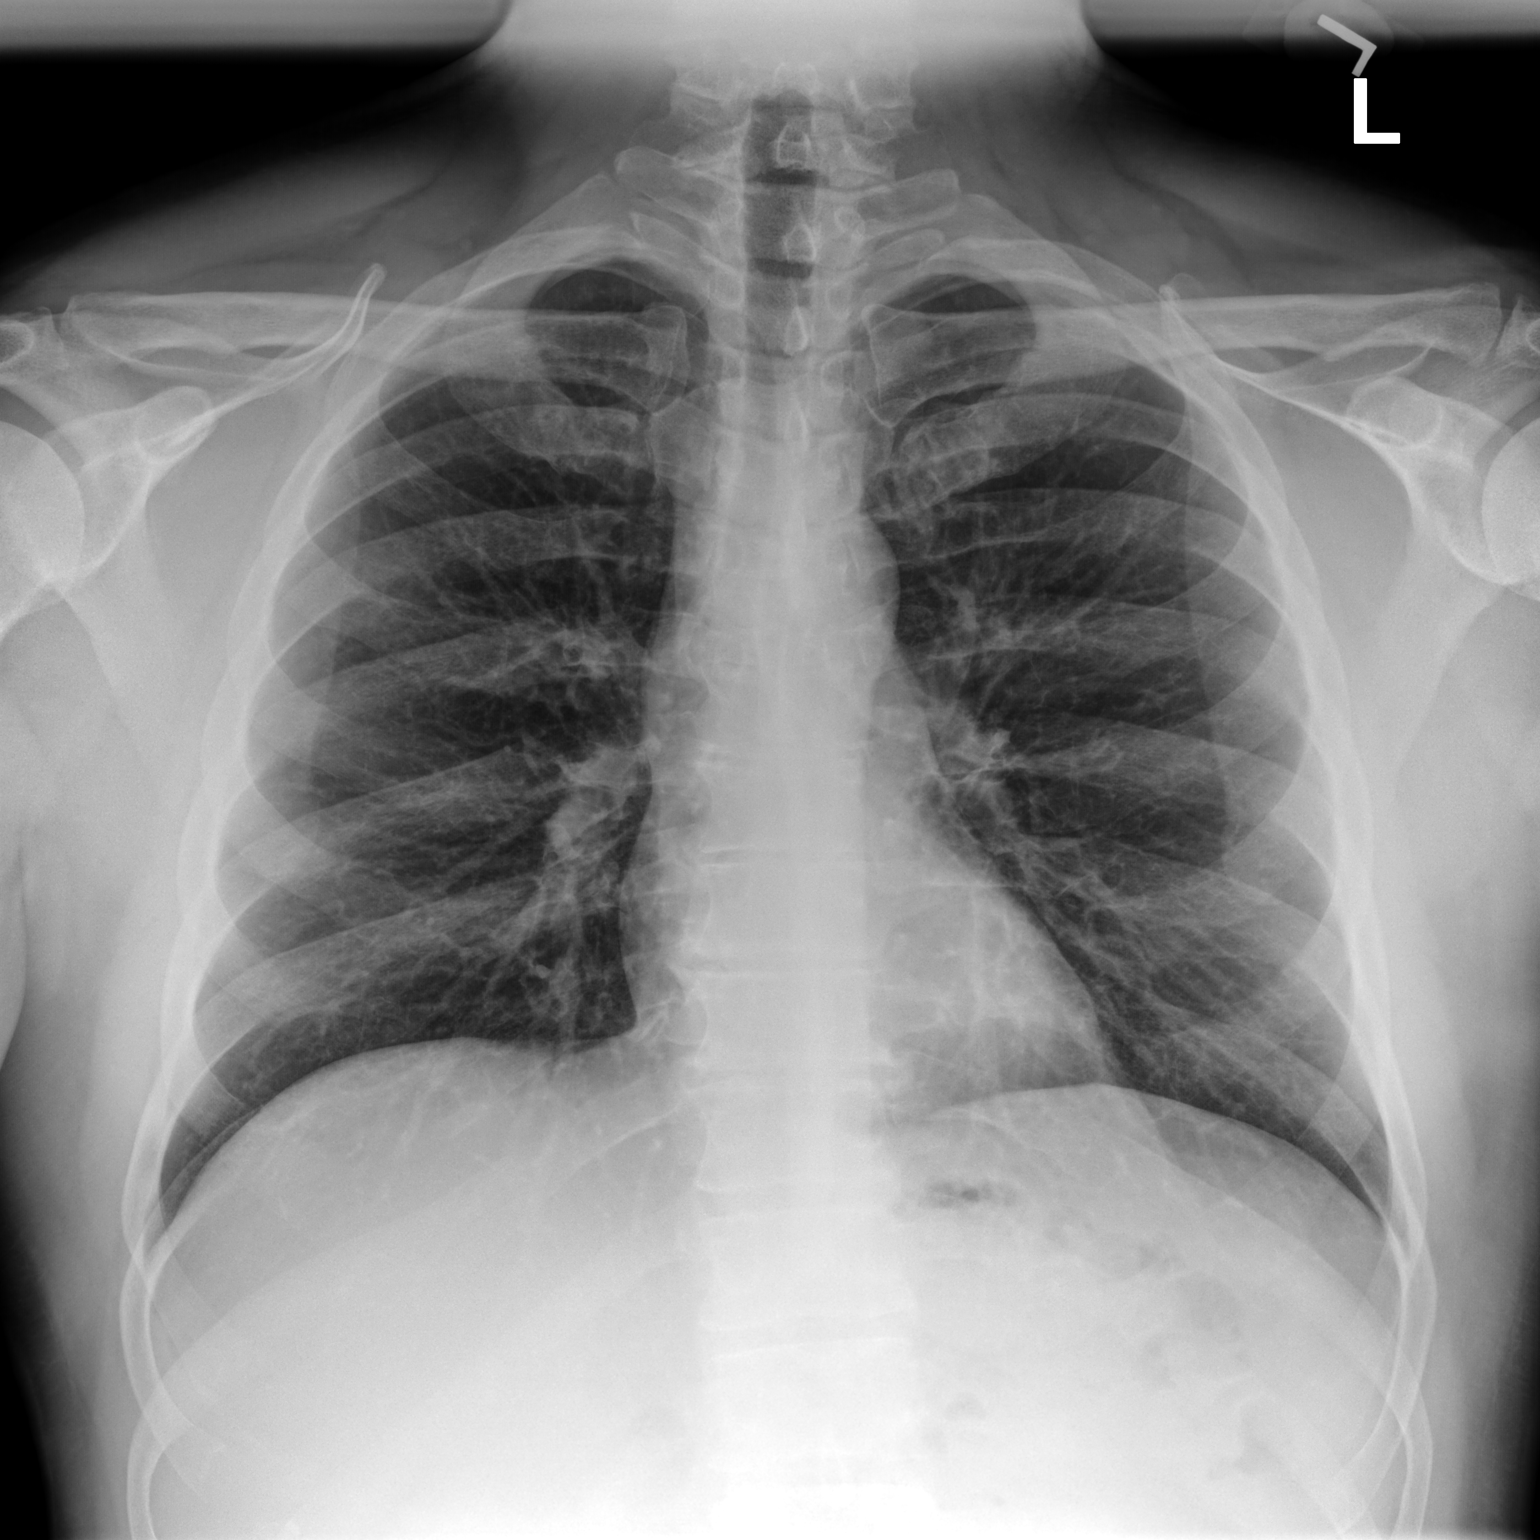

[chest lat]
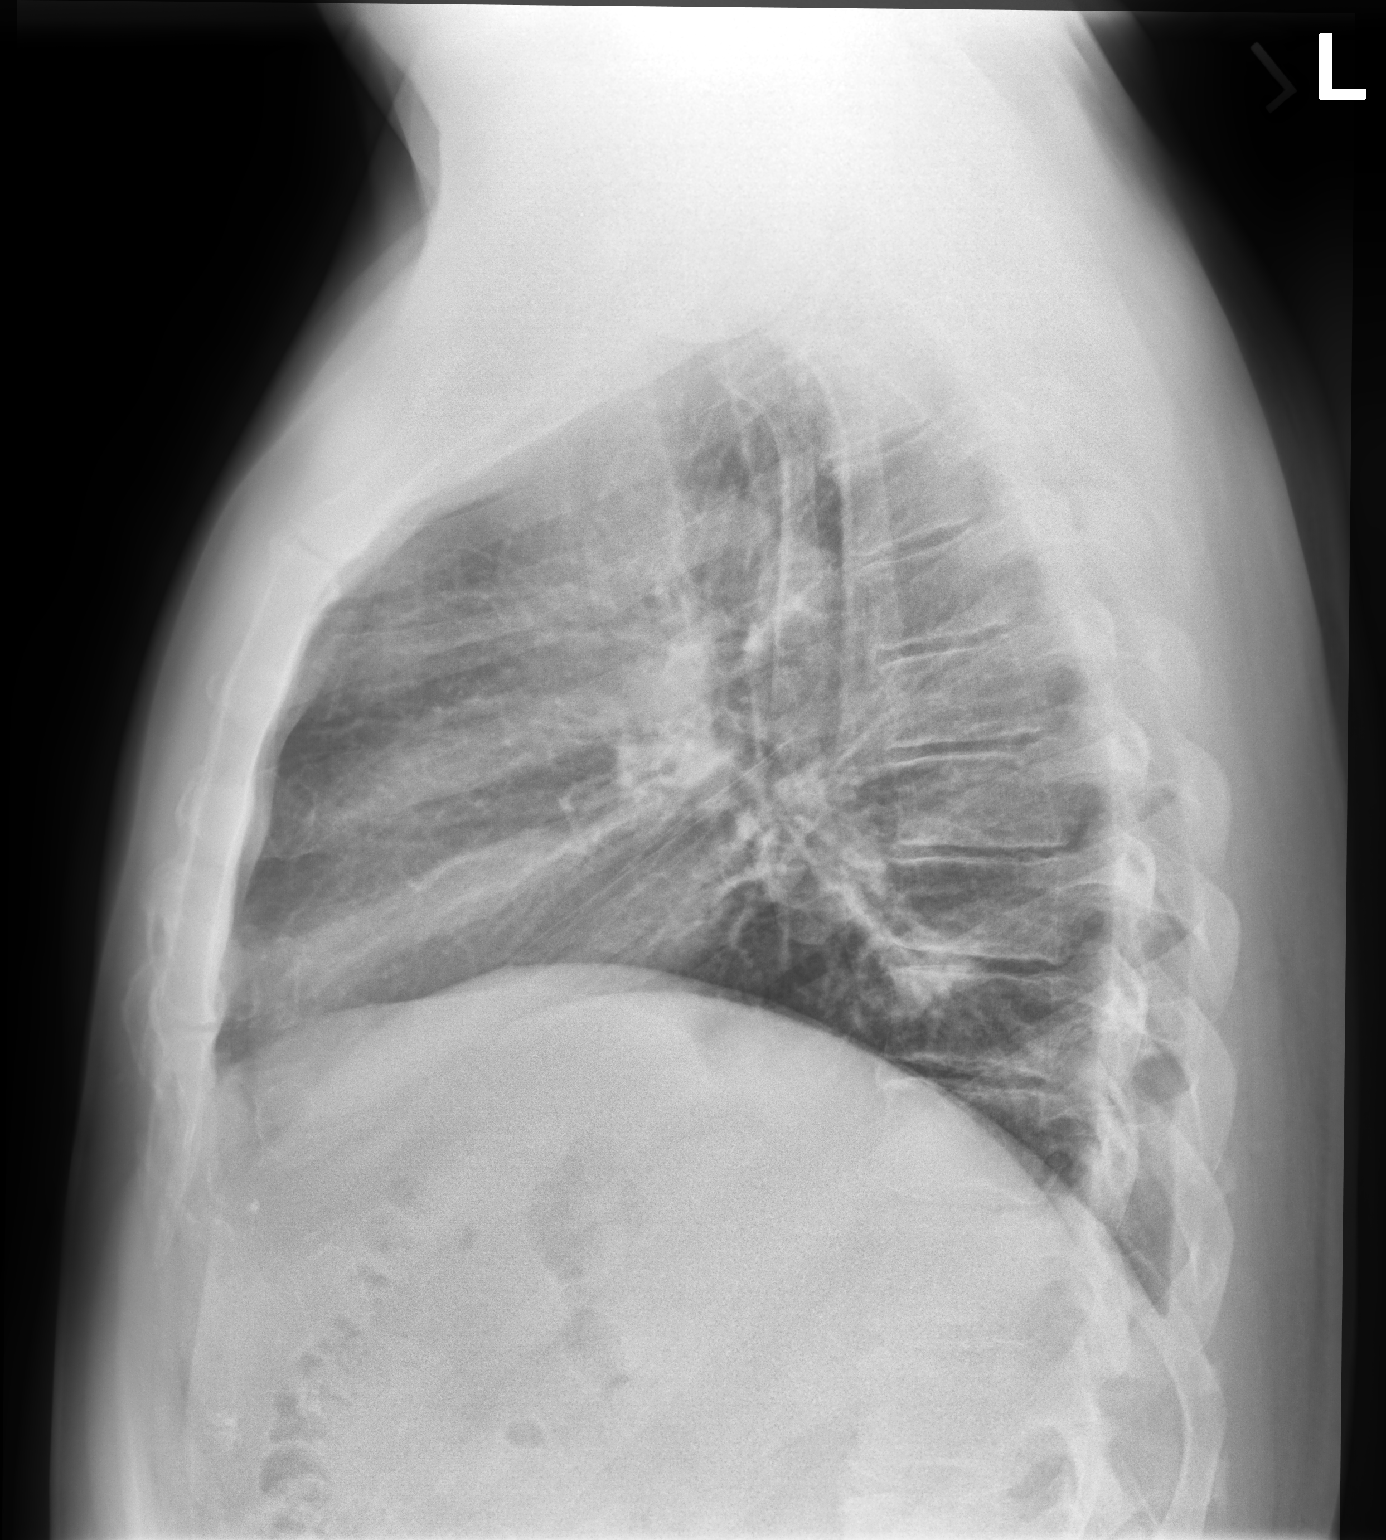

[2 of 2 positions shown; findings below may reference images not displayed]

FINDINGS: The cardiac silhouette, mediastinal and hilar contours are within
normal limits. There is mild peribronchial thickening and slight
increased interstitial markings suggesting bronchitis or reactive
airways disease. No infiltrates or effusions. The bony thorax is
intact.
IMPRESSION: Impression findings suggest bronchitis or reactive airways disease.
No infiltrates or effusions.

## 2021-09-23 ENCOUNTER — Encounter: Payer: Self-pay | Admitting: Internal Medicine

## 2021-10-26 ENCOUNTER — Other Ambulatory Visit: Payer: Self-pay

## 2021-10-26 ENCOUNTER — Ambulatory Visit (AMBULATORY_SURGERY_CENTER): Payer: Managed Care, Other (non HMO) | Admitting: *Deleted

## 2021-10-26 VITALS — Ht 75.0 in | Wt 225.0 lb

## 2021-10-26 DIAGNOSIS — Z1211 Encounter for screening for malignant neoplasm of colon: Secondary | ICD-10-CM

## 2021-10-26 MED ORDER — PLENVU 140 G PO SOLR: 1.0000 | ORAL | 0 refills | Status: DC

## 2021-10-26 NOTE — Progress Notes (Signed)
No egg or soy allergy known to patient  ?No issues known to pt with past sedation with any surgeries or procedures ?Patient denies ever being told they had issues or difficulty with intubation  ?No FH of Malignant Hyperthermia ?Pt is not on diet pills ?Pt is not on  home 02  ?Pt is not on blood thinners  ?Pt denies issues with constipation  ?No A fib or A flutter ? ?Pt is fully vaccinated  for Covid  ? ?Plenvu Coupon to pt in PV today , Code to Pharmacy and  NO PA's for preps discussed with pt In PV today  ?Discussed with pt there will be an out-of-pocket cost for prep and that varies from $0 to 70 +  dollars - pt verbalized understanding  ? ?Due to the COVID-19 pandemic we are asking patients to follow certain guidelines in PV and the [place]   ?Pt aware of COVID protocols and [facility] guidelines  ? ?PV completed over the phone. Pt verified name, DOB, address and insurance during PV today.  ?Pt mailed instruction packet with copy of consent form to read and not return, and instructions.  ?Pt encouraged to call with questions or issues.  ?If pt has My chart, procedure instructions sent via My Chart  ? ?

## 2021-11-01 ENCOUNTER — Encounter: Payer: Self-pay | Admitting: Certified Registered Nurse Anesthetist

## 2021-11-05 ENCOUNTER — Encounter: Payer: Self-pay | Admitting: Internal Medicine

## 2021-11-09 ENCOUNTER — Encounter: Payer: Self-pay | Admitting: Internal Medicine

## 2021-11-09 ENCOUNTER — Other Ambulatory Visit: Payer: Self-pay

## 2021-11-09 ENCOUNTER — Ambulatory Visit (AMBULATORY_SURGERY_CENTER): Payer: BC Managed Care – PPO | Admitting: Internal Medicine

## 2021-11-09 VITALS — BP 107/63 | HR 60 | Temp 97.3°F | Resp 20 | Ht 75.0 in | Wt 225.0 lb

## 2021-11-09 DIAGNOSIS — K635 Polyp of colon: Secondary | ICD-10-CM | POA: Diagnosis not present

## 2021-11-09 DIAGNOSIS — D123 Benign neoplasm of transverse colon: Secondary | ICD-10-CM | POA: Diagnosis not present

## 2021-11-09 DIAGNOSIS — D122 Benign neoplasm of ascending colon: Secondary | ICD-10-CM | POA: Diagnosis not present

## 2021-11-09 DIAGNOSIS — D127 Benign neoplasm of rectosigmoid junction: Secondary | ICD-10-CM

## 2021-11-09 DIAGNOSIS — Z1211 Encounter for screening for malignant neoplasm of colon: Secondary | ICD-10-CM | POA: Diagnosis not present

## 2021-11-09 MED ORDER — SODIUM CHLORIDE 0.9 % IV SOLN: 500.0000 mL | Freq: Once | INTRAVENOUS | Status: DC

## 2021-11-09 NOTE — Patient Instructions (Signed)
Information on polyps and hemorrhoids given to you today. ? ?Await pathology results. ? ?Resume previous diet and medications. ? ? ?Avoid NSAIDS (Aspirin, Ibuprofen, Aleve, Naproxen) for 2 weeks post procedure. You may use Tylenol as needed. ? ? ?YOU HAD AN ENDOSCOPIC PROCEDURE TODAY AT [facility]:   Refer to the procedure report that was given to you for any specific questions about what was found during the examination.  If the procedure report does not answer your questions, please call your gastroenterologist to clarify.  If you requested that your care partner not be given the details of your procedure findings, then the procedure report has been included in a sealed envelope for you to review at your convenience later. ? ?YOU SHOULD EXPECT: Some feelings of bloating in the abdomen. Passage of more gas than usual.  Walking can help get rid of the air that was put into your GI tract during the procedure and reduce the bloating. If you had a lower endoscopy (such as a colonoscopy or flexible sigmoidoscopy) you may notice spotting of blood in your stool or on the toilet paper. If you underwent a bowel prep for your procedure, you may not have a normal bowel movement for a few days. ? ?Please Note:  You might notice some irritation and congestion in your nose or some drainage.  This is from the oxygen used during your procedure.  There is no need for concern and it should clear up in a day or so. ? ?SYMPTOMS TO REPORT IMMEDIATELY: ? ?Following lower endoscopy (colonoscopy or flexible sigmoidoscopy): ? Excessive amounts of blood in the stool ? Significant tenderness or worsening of abdominal pains ? Swelling of the abdomen that is new, acute ? Fever of 100?F or higher ? ? ?For urgent or emergent issues, a gastroenterologist can be reached at any hour by calling [phone]. ?Do not use MyChart messaging for urgent concerns.  ? ? ?DIET:  We do recommend a small meal at first, but then you may  proceed to your regular diet.  Drink plenty of fluids but you should avoid alcoholic beverages for 24 hours. ? ?ACTIVITY:  You should plan to take it easy for the rest of today and you should NOT DRIVE or use heavy machinery until tomorrow (because of the sedation medicines used during the test).   ? ?FOLLOW UP: ?Our staff will call the number listed on your records 48-72 hours following your procedure to check on you and address any questions or concerns that you may have regarding the information given to you following your procedure. If we do not reach you, we will leave a message.  We will attempt to reach you two times.  During this call, we will ask if you have developed any symptoms of COVID 19. If you develop any symptoms (ie: fever, flu-like symptoms, shortness of breath, cough etc.) before then, please call [phone].  If you test positive for Covid 19 in the 2 weeks post procedure, please call and report this information to [place].   ? ?If any biopsies were taken you will be contacted by phone or by letter within the next 1-3 weeks.  Please call us at [phone] if you have not heard about the biopsies in 3 weeks.  ? ? ?SIGNATURES/CONFIDENTIALITY: ?You and/or your care partner have signed paperwork which will be entered into your electronic medical record.  These signatures attest to the fact that that the information above on your After Visit Summary has  been reviewed and is understood.  Full responsibility of the confidentiality of this discharge information lies with you and/or your care-partner.  ?

## 2021-11-09 NOTE — Progress Notes (Signed)
? ?GASTROENTEROLOGY PROCEDURE H&P NOTE  ? ?Primary Care Physician: ?Libby Maw, MD ? ? ? ?Reason for Procedure:  Colon cancer screening ? ?Plan:    Colonoscopy ? ?Patient is appropriate for endoscopic procedure(s) in the ambulatory ([place]) setting. ? ?The nature of the procedure, as well as the risks, benefits, and alternatives were carefully and thoroughly reviewed with the patient. Ample time for discussion and questions allowed. The patient understood, was satisfied, and agreed to proceed.  ? ? ? ?HPI: ?Steve Allen is a 51 y.o. male who presents for screening colonoscopy.  Medical history as below.  Tolerated the prep.  No recent chest pain or shortness of breath.  No abdominal pain today. ? ?Past Medical History:  ?Diagnosis Date  ? Allergy   ? mild with pollen  ? Diabetes mellitus without complication ([place])   ? diet controlled - no meds  ? Gout   ? Hemorrhoids   ? ? ?Past Surgical History:  ?Procedure Laterality Date  ? NO PAST SURGERIES    ? ? ?Prior to Admission medications   ?Medication Sig Start Date End Date Taking? Authorizing Provider  ?Ascorbic Acid (VITAMIN C) 1000 MG tablet Take 1,000 mg by mouth daily.   Yes [provider]  ?Cholecalciferol (VITAMIN D3 PO) Take by mouth.   Yes [provider]  ?Omega-3 Fatty Acids (FISH OIL PO) Take by mouth.   Yes [provider]  ? ? ?Current Outpatient Medications  ?Medication Sig Dispense Refill  ? Ascorbic Acid (VITAMIN C) 1000 MG tablet Take 1,000 mg by mouth daily.    ? Cholecalciferol (VITAMIN D3 PO) Take by mouth.    ? Omega-3 Fatty Acids (FISH OIL PO) Take by mouth.    ? ?Current Facility-Administered Medications  ?Medication Dose Route Frequency Provider Last Rate Last Admin  ? 0.9 %  sodium chloride infusion  500 mL Intravenous Once Tremaine Earwood, Lajuan Lines, MD      ? ? ?Allergies as of 11/09/2021  ? (No Known Allergies)  ? ? ?Family History  ?Problem Relation Age of Onset  ? Stomach cancer Maternal Aunt   ? Cancer -  Colon Neg Hx   ? Cancer - Prostate Neg Hx   ? Colon cancer Neg Hx   ? Esophageal cancer Neg Hx   ? Colon polyps Neg Hx   ? Rectal cancer Neg Hx   ? ? ?Social History  ? ?Socioeconomic History  ? Marital status: Married  ?  Spouse name: Not on file  ? Number of children: 1  ? Years of education: Not on file  ? Highest education level: Not on file  ?Occupational History  ? Occupation: Electrical engineer for [employer]  ?Tobacco Use  ? Smoking status: Never  ? Smokeless tobacco: Never  ?Vaping Use  ? Vaping Use: Never used  ?Substance and Sexual Activity  ? Alcohol use: Yes  ?  Comment: 1-2 glasses of wine or beer a week  ? Drug use: No  ? Sexual activity: Yes  ?Other Topics Concern  ? Not on file  ?Social History Narrative  ? grow up in [place] , in [place] since [place]   ?   ?   ?   ?   ? ?Social Determinants of Health  ? ?Financial Resource Strain: Not on file  ?Food Insecurity: Not on file  ?Transportation Needs: Not on file  ?Physical Activity: Not on file  ?Stress: Not on file  ?Social Connections: Not on file  ?Intimate Partner Violence: Not  on file  ? ? ?Physical Exam: ?Vital signs in last 24 hours: ?'@BP'$  112/71 (BP Location: Right Arm, Patient Position: Sitting, Cuff Size: Normal)   Pulse 89   Temp (!) 97.3 ?F (36.3 ?C) (Temporal)   Ht '6\' 3"'$  (1.905 m)   Wt 225 lb (102.1 kg)   SpO2 97%   BMI 28.12 kg/m?  ?GEN: NAD ?EYE: Sclerae anicteric ?ENT: MMM ?CV: Non-tachycardic ?Pulm: CTA b/l ?GI: Soft, NT/ND ?NEURO:  Alert & Oriented x 3 ? ? ?Zenovia Jarred, MD ?[facility] ? ?11/09/2021 8:29 AM ? ?

## 2021-11-09 NOTE — Progress Notes (Signed)
Called to room to assist during endoscopic procedure.  Patient ID and intended procedure confirmed with present staff. Received instructions for my participation in the procedure from the performing physician.  

## 2021-11-09 NOTE — Op Note (Signed)
[facility] ?Patient Name: Steve Allen ?Procedure Date: 11/09/2021 8:32 AM ?MRN: [id number] ?Endoscopist: Jerene Bears , MD ?Age: 51 ?Referring MD:  ?Date of Birth: 12/14/1970 ?Gender: Male ?Account #: [id number] ?Procedure:                Colonoscopy ?Indications:              Screening for colorectal malignant neoplasm, This  ?                          is the patient's first colonoscopy ?Medicines:                Monitored Anesthesia Care ?Procedure:                Pre-Anesthesia Assessment: ?                          - Prior to the procedure, a History and Physical  ?                          was performed, and patient medications and  ?                          allergies were reviewed. The patient's tolerance of  ?                          previous anesthesia was also reviewed. The risks  ?                          and benefits of the procedure and the sedation  ?                          options and risks were discussed with the patient.  ?                          All questions were answered, and informed consent  ?                          was obtained. Prior Anticoagulants: The patient has  ?                          taken no previous anticoagulant or antiplatelet  ?                          agents. ASA Grade Assessment: II - A patient with  ?                          mild systemic disease. After reviewing the risks  ?                          and benefits, the patient was deemed in  ?                          satisfactory condition to undergo the procedure. ?  After obtaining informed consent, the colonoscope  ?                          was passed under direct vision. Throughout the  ?                          procedure, the patient's blood pressure, pulse, and  ?                          oxygen saturations were monitored continuously. The  ?                          Olympus scope [id number] was introduced through the  ?                          anus and advanced to the  cecum, identified by  ?                          appendiceal orifice and ileocecal valve. The  ?                          colonoscopy was performed without difficulty. The  ?                          patient tolerated the procedure well. The quality  ?                          of the bowel preparation was good. The ileocecal  ?                          valve, appendiceal orifice, and rectum were  ?                          photographed. ?Scope In: 8:38:20 AM ?Scope Out: 9:06:02 AM ?Scope Withdrawal Time: 0 hours 24 minutes 21 seconds  ?Total Procedure Duration: 0 hours 27 minutes 42 seconds  ?Findings:                 The digital rectal exam was normal. ?                          Four sessile polyps were found in the ascending  ?                          colon. The polyps were 4 to 9 mm in size. These  ?                          polyps were removed with a cold snare. Resection  ?                          and retrieval were complete. ?                          A 2 mm polyp was found in the distal transverse  ?  colon. The polyp was sessile. The polyp was removed  ?                          with a cold biopsy forceps. Resection and retrieval  ?                          were complete. ?                          Two sessile and semi-pedunculated polyps were found  ?                          in the transverse colon. The polyps were 5 to 10 mm  ?                          in size. These polyps were removed with a cold  ?                          snare. Resection and retrieval were complete. ?                          A 15 mm polyp was found in the recto-sigmoid colon.  ?                          The polyp was pedunculated. The polyp was removed  ?                          with a hot snare. Resection and retrieval were  ?                          complete. ?                          Internal hemorrhoids were found during  ?                          retroflexion. The hemorrhoids were  medium-sized. ?Complications:            No immediate complications. ?Estimated Blood Loss:     Estimated blood loss was minimal. ?Impression:               - Four 4 to 9 mm polyps in the ascending colon,  ?                          removed with a cold snare. Resected and retrieved. ?                          - One 2 mm polyp in the distal transverse colon,  ?                          removed with a cold biopsy forceps. Resected and  ?                          retrieved. ?                          -  Two 5 to 10 mm polyps in the transverse colon,  ?                          removed with a cold snare. Resected and retrieved. ?                          - One 15 mm polyp at the recto-sigmoid colon,  ?                          removed with a hot snare. Resected and retrieved. ?                          - Internal hemorrhoids. ?Recommendation:           - Patient has a contact number available for  ?                          emergencies. The signs and symptoms of potential  ?                          delayed complications were discussed with the  ?                          patient. Return to normal activities tomorrow.  ?                          Written discharge instructions were provided to the  ?                          patient. ?                          - Resume previous diet. ?                          - Continue present medications. ?                          - No aspirin, ibuprofen, naproxen, or other  ?                          non-steroidal anti-inflammatory drugs for 2 weeks  ?                          after polyp removal. ?                          - Await pathology results. ?                          - Repeat colonoscopy is recommended for  ?                          surveillance. The colonoscopy date will be  ?  determined after pathology results from today's  ?                          exam become available for review. ?Jerene Bears, MD ?11/09/2021 9:10:56 AM ?This report has been  signed electronically. ?

## 2021-11-09 NOTE — Progress Notes (Signed)
Pt's states no medical or surgical changes since previsit or office visit. 

## 2021-11-09 NOTE — Progress Notes (Signed)
Report given to PACU, vss 

## 2021-11-11 ENCOUNTER — Telehealth: Payer: Self-pay | Admitting: *Deleted

## 2021-11-11 ENCOUNTER — Telehealth: Payer: Self-pay

## 2021-11-11 NOTE — Telephone Encounter (Signed)
No answer on first attempt follow up call. Left message.  ?

## 2021-11-11 NOTE — Telephone Encounter (Signed)
?  Follow up Call- ? ? ?  11/09/2021  ?  7:42 AM  ?Call back number  ?Post procedure Call Back phone  # [phone]  ?Permission to leave phone message Yes  ?  ? ?Patient questions: ? ?Do you have a fever, pain , or abdominal swelling? No. ?Pain Score  0 * ? ?Have you tolerated food without any problems? Yes.   ? ?Have you been able to return to your normal activities? Yes.   ? ?Do you have any questions about your discharge instructions: ?Diet   No. ?Medications  No. ?Follow up visit  No. ? ?Do you have questions or concerns about your Care? No. ? ?Actions: ?* If pain score is 4 or above: ?No action needed, pain <4. ? ? ?

## 2021-11-16 ENCOUNTER — Encounter: Payer: Self-pay | Admitting: Internal Medicine

## 2023-10-30 ENCOUNTER — Ambulatory Visit

## 2023-10-30 ENCOUNTER — Ambulatory Visit
Admission: RE | Admit: 2023-10-30 | Discharge: 2023-10-30 | Disposition: A | Discharge status: Home / Self Care | Source: Ambulatory Visit | Attending: Emergency Medicine

## 2023-10-30 ENCOUNTER — Ambulatory Visit (INDEPENDENT_AMBULATORY_CARE_PROVIDER_SITE_OTHER): Admitting: Radiology

## 2023-10-30 VITALS — BP 133/82 | HR 84 | Temp 98.1°F | Resp 17

## 2023-10-30 DIAGNOSIS — M25522 Pain in left elbow: Secondary | ICD-10-CM

## 2023-10-30 DIAGNOSIS — M7022 Olecranon bursitis, left elbow: Secondary | ICD-10-CM

## 2023-10-30 MED ORDER — NAPROXEN 375 MG PO TABS: 375.0000 mg | ORAL_TABLET | Freq: Two times a day (BID) | ORAL | 0 refills | Status: AC

## 2023-10-30 MED ORDER — PREDNISONE 10 MG PO TABS: 40.0000 mg | ORAL_TABLET | Freq: Every day | ORAL | 0 refills | Status: AC

## 2023-10-30 NOTE — ED Triage Notes (Addendum)
 Pt c/o elbow pain for 1 week. States joint is swollen.  He had an injury 15-20 years ago. Pain is worse at night and with movement.

## 2023-10-30 NOTE — Discharge Instructions (Addendum)
 Start taking the steroids today and take them daily with breakfast.  You can also take the naproxen twice daily with food to help with pain and swelling.  Keep the elbow compressed to help with swelling.  You can also ice as needed.  Taking steroids with anti-inflammatories can increase her risk of gastrointestinal bleeding.  Stop this medication immediately if you develop any blood in your stool or any new concerning symptoms.  If your elbow pain persist despite these interventions, please follow-up with an orthopedic for further evaluation.

## 2023-10-30 NOTE — ED Provider Notes (Addendum)
 [facility]    CSN: [id number] Arrival date & time: 10/30/23  1056      History   Chief Complaint Chief Complaint  Patient presents with   Joint Pain    Elbow swollen and painful - Entered by patient    HPI Steve Allen is a 53 y.o. male.   Patient presents to clinic over concerns of left elbow pain, swelling, warmth and redness that has been ongoing for the past week.  Prior to this he was doing some yard work outside.  He has not had any recent injuries, did injure the elbow around 10 to 15 years ago and had a similar presentation.  Does have a history of type 2 diabetes and gout.  Reports his type 2 diabetes is controlled with diet and exercise, last A1c on file was 7 years ago and normal.  Last gout flare was years ago as well and in his toes.  Has not had any recent injuries, rashes, bites or drainage from the elbow.  Full range of motion intact.  Denies fevers. Pain is worse at night.   The history is provided by the patient and medical records.    Past Medical History:  Diagnosis Date   Allergy    mild with pollen   Diabetes mellitus without complication ([facility])    diet controlled - no meds   Gout    Hemorrhoids     Patient Active Problem List   Diagnosis Date Noted   Elevated glucose 10/09/2018   Elevated triglycerides with high cholesterol 10/09/2018   Elevated liver enzymes 10/09/2018   Reactive airway disease 10/09/2018   Hypertriglyceridemia 10/09/2018   Type 2 diabetes mellitus without complication, without long-term current use of insulin ([facility]) 10/09/2018   Cough 10/02/2018   Health care maintenance 10/02/2018   Hemorrhoids    GOUT 12/19/2008    Past Surgical History:  Procedure Laterality Date   NO PAST SURGERIES         Home Medications    Prior to Admission medications   Medication Sig Start Date End Date Taking? Authorizing Provider  naproxen (NAPROSYN) 375 MG tablet Take 1 tablet (375 mg total) by mouth 2 (two) times  daily. 10/30/23  Yes Rinaldo Ratel, [place] N, FNP  predniSONE (DELTASONE) 10 MG tablet Take 4 tablets (40 mg total) by mouth daily with breakfast for 5 days. 10/30/23 11/04/23 Yes Rinaldo Ratel, [place] N, FNP  Ascorbic Acid (VITAMIN C) 1000 MG tablet Take 1,000 mg by mouth daily.    [provider]  Cholecalciferol (VITAMIN D3 PO) Take by mouth.    [provider]  Omega-3 Fatty Acids (FISH OIL PO) Take by mouth.    [provider]    Family History Family History  Problem Relation Age of Onset   Stomach cancer Maternal Aunt    Cancer - Colon Neg Hx    Cancer - Prostate Neg Hx    Colon cancer Neg Hx    Esophageal cancer Neg Hx    Colon polyps Neg Hx    Rectal cancer Neg Hx     Social History Social History   Tobacco Use   Smoking status: Never   Smokeless tobacco: Never  Vaping Use   Vaping status: Never Used  Substance Use Topics   Alcohol use: Yes    Comment: 1-2 glasses of wine or beer a week   Drug use: No     Allergies   Patient has no known allergies.   Review of Systems  Review of Systems  Per HPI   Physical Exam Triage Vital Signs ED Triage Vitals  Encounter Vitals Group     BP 10/30/23 1109 133/82     Systolic BP Percentile --      Diastolic BP Percentile --      Pulse Rate 10/30/23 1109 84     Resp 10/30/23 1109 17     Temp 10/30/23 1109 98.1 F (36.7 C)     Temp Source 10/30/23 1109 Oral     SpO2 10/30/23 1109 97 %     Weight --      Height --      Head Circumference --      Peak Flow --      Pain Score 10/30/23 1112 7     Pain Loc --      Pain Education --      Exclude from Growth Chart --    No data found.  Updated Vital Signs BP 133/82 (BP Location: Right Arm)   Pulse 84   Temp 98.1 F (36.7 C) (Oral)   Resp 17   SpO2 97%   Visual Acuity Right Eye Distance:   Left Eye Distance:   Bilateral Distance:    Right Eye Near:   Left Eye Near:    Bilateral Near:     Physical Exam Vitals and nursing note  reviewed.  Constitutional:      Appearance: Normal appearance.  HENT:     Head: Normocephalic and atraumatic.     Right Ear: External ear normal.     Left Ear: External ear normal.     Nose: Nose normal.     Mouth/Throat:     Mouth: Mucous membranes are moist.  Eyes:     Conjunctiva/sclera: Conjunctivae normal.  Cardiovascular:     Rate and Rhythm: Normal rate.     Pulses: Normal pulses.  Pulmonary:     Effort: Pulmonary effort is normal. No respiratory distress.  Musculoskeletal:        General: Swelling and tenderness present. No deformity or signs of injury. Normal range of motion.     Right elbow: Effusion present. Normal range of motion. Tenderness present in olecranon process.       Arms:  Skin:    General: Skin is warm and dry.     Capillary Refill: Capillary refill takes less than 2 seconds.     Findings: Erythema present.  Neurological:     General: No focal deficit present.     Mental Status: He is alert.  Psychiatric:        Mood and Affect: Mood normal.      UC Treatments / Results  Labs (all labs ordered are listed, but only abnormal results are displayed) Labs Reviewed - No data to display  EKG   Radiology No results found.  Procedures Procedures (including critical care time)  Medications Ordered in UC Medications - No data to display  Initial Impression / Assessment and Plan / UC Course  I have reviewed the triage vital signs and the nursing notes.  Pertinent labs & imaging results that were available during my care of the patient were reviewed by me and considered in my medical decision making (see chart for details).  Vitals and triage reviewed, patient is hemodynamically stable.  Left posterior elbow with erythema, warmth and swelling.  Full range of motion intact.  Afebrile.  Lower concern for septic bursitis with this presentation.  Elbow imaging without obvious fracture or abnormalities by  my interpretation.  Awaiting official radiology  overread.  Discussed possibility of gout versus bursitis, would be treated the same with steroids and anti-inflammatory.  Less concern for gout due to no flares in the recent years with diet and lifestyle changes.  Oral steroid burst and anti-inflammatories, Ace wrap for compression.  Plan of care, follow-up care return precautions given, no questions at this time.  Official radiology overread shows soft tissue swelling, no acute bony abnormality.  No changes to treatment plan at this time.     Final Clinical Impressions(s) / UC Diagnoses   Final diagnoses:  Left elbow pain  Olecranon bursitis of left elbow     Discharge Instructions      Start taking the steroids today and take them daily with breakfast.  You can also take the naproxen twice daily with food to help with pain and swelling.  Keep the elbow compressed to help with swelling.  You can also ice as needed.  Taking steroids with anti-inflammatories can increase her risk of gastrointestinal bleeding.  Stop this medication immediately if you develop any blood in your stool or any new concerning symptoms.  If your elbow pain persist despite these interventions, please follow-up with an orthopedic for further evaluation.      ED Prescriptions     Medication Sig Dispense Auth. Provider   predniSONE (DELTASONE) 10 MG tablet Take 4 tablets (40 mg total) by mouth daily with breakfast for 5 days. 20 tablet Rinaldo Ratel, [place] N, [place]   naproxen (NAPROSYN) 375 MG tablet Take 1 tablet (375 mg total) by mouth 2 (two) times daily. 20 tablet Dailin Sosnowski, [place] N, [place]      PDMP not reviewed this encounter.   Islam Villescas, [place] N, FNP 10/30/23 1156    Omarie Parcell, [place] N, [place] 10/30/23 1302
